# Patient Record
Sex: Male | Born: 2000 | Race: Asian | Hispanic: No | Marital: Single | Smoking: Never smoker
Health system: Southern US, Community
[De-identification: ages and names within clinical notes are randomized; demographics above are authoritative.]

## PROBLEM LIST (undated history)

## (undated) DIAGNOSIS — E079 Disorder of thyroid, unspecified: Secondary | ICD-10-CM

## (undated) DIAGNOSIS — J3081 Allergic rhinitis due to animal (cat) (dog) hair and dander: Secondary | ICD-10-CM

## (undated) DIAGNOSIS — J45909 Unspecified asthma, uncomplicated: Secondary | ICD-10-CM

## (undated) DIAGNOSIS — R000 Tachycardia, unspecified: Secondary | ICD-10-CM

## (undated) HISTORY — DX: Disorder of thyroid, unspecified: E07.9

---
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
## 2019-06-04 ENCOUNTER — Other Ambulatory Visit: Payer: Self-pay

## 2019-06-04 ENCOUNTER — Ambulatory Visit
Admission: EM | Admit: 2019-06-04 | Discharge: 2019-06-04 | Disposition: A | Discharge status: Home / Self Care | Payer: BC Managed Care – PPO

## 2019-06-04 DIAGNOSIS — R208 Other disturbances of skin sensation: Secondary | ICD-10-CM

## 2019-06-04 NOTE — ED Provider Notes (Signed)
[name]    CSN: [id number] Arrival date & time: 06/04/19  1324      History   Chief Complaint Chief Complaint  Patient [name] with  . burning sensation    HPI Rodney [name] is a 18 y.o. male.   Rodney [name] with a burning sensation of his skin. He reports this happened for the first time 1 week ago and then happened again today. He reports both episodes started with a "cold chill" that causes a whole body shiver, followed by a burning sensation that starts in the center of his face. The burning sensation intensifies after a few seconds, makes him grimace, then spread out to behind his ears, down his neck and back, and along his extremities. He reports the burning sensation is worse with movement. Both episodes lasted about 20 minutes and will improve without intervention. He has used hydrocortisone spray/cream that helps to improve the sensation faster. Reports both episode have occurred while driving. He reports feeling tired after the episode. He denies vision changes, headaches, anxiety, palpitations, dizziness, flushing of his skin, ringing in his ears, numbness, weakness, fever, or rashes. No history of diabetes.      Past Medical History:  Diagnosis Date  . Asthma     There are no active problems to display for this patient.   History reviewed. No pertinent surgical history.     Home Medications    Prior to Admission medications   Medication Sig Start Date End Date Taking? Authorizing Provider  albuterol (PROVENTIL HFA;VENTOLIN HFA) 108 (90 BASE) MCG/ACT inhaler Inhale into the lungs every 6 (six) hours as needed for wheezing or shortness of breath.    [provider]    Family History Family History  Problem Relation Age of Onset  . Healthy Mother   . Healthy Father     Social History Social History   Tobacco Use  . Smoking status: Never Smoker  . Smokeless tobacco: Never Used  Substance Use Topics  .  Alcohol use: No  . Drug use: No     Allergies   Patient has no known allergies.   Review of Systems Review of Systems  See HPI.    Physical Exam Triage Vital Signs ED Triage Vitals  Enc Vitals Group     BP 06/04/19 1346 115/72     Pulse Rate 06/04/19 1346 (!) 109     Resp 06/04/19 1346 16     Temp 06/04/19 1346 98.5 F (36.9 C)     Temp Source 06/04/19 1346 Oral     SpO2 06/04/19 1355 98 %     Weight --      Height --      Head Circumference --      Peak Flow --      Pain Score 06/04/19 1347 8     Pain Loc --      Pain Edu? --      Excl. in [place]? --    No data found.  Updated Vital Signs BP 115/72 (BP Location: Left Arm)   Pulse (!) 109   Temp 98.5 F (36.9 C) (Oral)   Resp 16   SpO2 98%   Physical Exam Constitutional:      General: He is not in acute distress.    Appearance: Normal appearance. He is not ill-appearing or toxic-appearing.  HENT:     Head: Normocephalic and atraumatic.     Nose: No congestion.  Eyes:     Extraocular Movements:  Extraocular movements intact.     Conjunctiva/sclera: Conjunctivae normal.     Pupils: Pupils are equal, round, and reactive to light.  Neck:     Musculoskeletal: Normal range of motion and neck supple.  Cardiovascular:     Rate and Rhythm: Normal rate and regular rhythm.  Pulmonary:     Effort: Pulmonary effort is normal.     Breath sounds: Normal breath sounds.  Musculoskeletal: Normal range of motion.  Skin:    General: Skin is warm and dry.     Findings: No erythema or rash.  Neurological:     General: No focal deficit present.     Mental Status: He is alert and oriented to person, place, and time.     GCS: GCS eye subscore is 4. GCS verbal subscore is 5. GCS motor subscore is 6.     Cranial Nerves: Cranial nerves are intact. No cranial nerve deficit.     Sensory: Sensation is intact. No sensory deficit.     Motor: Motor function is intact. No weakness.     Coordination: Coordination is intact.  Coordination normal.     Gait: Gait normal.     Comments: Patient able to ambulate on own without difficulty.   Psychiatric:        Mood and Affect: Mood normal.        Behavior: Behavior normal.    UC Treatments / Results  Labs (all labs ordered are listed, but only abnormal results are displayed) Labs Reviewed - No data to display  EKG   Radiology No results found.  Procedures Procedures (including critical care time)  Medications Ordered in UC Medications - No data to display  Initial Impression / Assessment and Plan / UC Course  I have reviewed the triage vital signs and the nursing notes.  Pertinent labs & imaging results that were available during my care of the patient were reviewed by me and considered in my medical decision making (see chart for details).   No focal deficits on exam today. No rashes or other concerning findings. Reassurance provided. Discussed following up with PCP if symptoms continue for further evaluation. Instructed not to use steroid cream on the face, but ok to use elsewhere. Return precautions were given. Patient verbalizes understanding.   Final Clinical Impressions(s) / UC Diagnoses   Final diagnoses:  Burning sensation of skin   ED Prescriptions    None        Ok Edwards, PA-C 06/04/19 1518

## 2019-06-04 NOTE — ED Triage Notes (Signed)
Pt presents to UC w/ c/o burning sensation that starts in center of face and radiates to other parts of entire body. Pt reports this happens at any time and happens when anything touches skin. Pt reports this started a week ago. Pt reports using hydrocortisone spray last week with little relief.

## 2019-06-04 NOTE — Discharge Instructions (Signed)
No alarming signs on exam. Follow up with PCP for further evaluation if symptoms not improving.

## 2019-06-27 ENCOUNTER — Other Ambulatory Visit: Payer: Self-pay

## 2019-06-27 DIAGNOSIS — Z20822 Contact with and (suspected) exposure to covid-19: Secondary | ICD-10-CM

## 2019-06-29 LAB — NOVEL CORONAVIRUS, NAA: SARS-CoV-2, NAA: NOT DETECTED

## 2019-09-03 ENCOUNTER — Other Ambulatory Visit: Payer: Self-pay

## 2019-09-03 DIAGNOSIS — Z20822 Contact with and (suspected) exposure to covid-19: Secondary | ICD-10-CM

## 2019-09-04 LAB — NOVEL CORONAVIRUS, NAA: SARS-CoV-2, NAA: NOT DETECTED

## 2019-09-05 ENCOUNTER — Ambulatory Visit
Admission: EM | Admit: 2019-09-05 | Discharge: 2019-09-05 | Disposition: A | Discharge status: Home / Self Care | Payer: BC Managed Care – PPO | Attending: Physician Assistant | Admitting: Physician Assistant

## 2019-09-05 DIAGNOSIS — J029 Acute pharyngitis, unspecified: Secondary | ICD-10-CM

## 2019-09-05 MED ORDER — LIDOCAINE VISCOUS HCL 2 % MT SOLN: OROMUCOSAL | 0 refills | Status: DC

## 2019-09-05 NOTE — ED Provider Notes (Signed)
[name]    CSN: [id number] Arrival date & time: 09/05/19  1006      History   Chief Complaint Chief Complaint  Patient presents with  . Sore Throat    HPI Rodney [name] is a 18 y.o. male.   18 year old male comes in for continued sore throat. States started having diarrhea and sore throat 5 days ago. Had some nausea without vomiting. Denies rhinorrhea, nasal congestion, cough. Denies fever, chills, body aches. Denies abdominal pain. Denies shortness of breath, loss of taste/smell. Went for COVID testing 2 days ago with negative testing. Now only with sore throat. No sick/COVID contact. Never smoker. No intervention tried.      Past Medical History:  Diagnosis Date  . Asthma     There are no active problems to display for this patient.   History reviewed. No pertinent surgical history.     Home Medications    Prior to Admission medications   Medication Sig Start Date End Date Taking? Authorizing Provider  albuterol (PROVENTIL HFA;VENTOLIN HFA) 108 (90 BASE) MCG/ACT inhaler Inhale into the lungs every 6 (six) hours as needed for wheezing or shortness of breath.    [provider]  lidocaine (XYLOCAINE) 2 % solution 5-15 mL gurgle as needed 09/05/19   Ok Edwards, PA-C    Family History Family History  Problem Relation Age of Onset  . Healthy Mother   . Healthy Father     Social History Social History   Tobacco Use  . Smoking status: Never Smoker  . Smokeless tobacco: Never Used  Substance Use Topics  . Alcohol use: No  . Drug use: No     Allergies   Patient has no known allergies.   Review of Systems Review of Systems  Reason unable to perform ROS: See HPI as above.     Physical Exam Triage Vital Signs ED Triage Vitals [09/05/19 1031]  Enc Vitals Group     BP 114/78     Pulse Rate (!) 109     Resp 18     Temp 98.4 F (36.9 C)     Temp Source Oral     SpO2 98 %     Weight      Height      Head  Circumference      Peak Flow      Pain Score 3     Pain Loc      Pain Edu?      Excl. in [place]?    No data found.  Updated Vital Signs BP 114/78 (BP Location: Left Arm)   Pulse (!) 109   Temp 98.4 F (36.9 C) (Oral)   Resp 18   SpO2 98%   Physical Exam Constitutional:      General: He is not in acute distress.    Appearance: Normal appearance. He is not ill-appearing, toxic-appearing or diaphoretic.  HENT:     Head: Normocephalic and atraumatic.     Right Ear: Tympanic membrane and ear canal normal. Tympanic membrane is not erythematous.     Left Ear: Tympanic membrane and ear canal normal. Tympanic membrane is not erythematous.     Mouth/Throat:     Mouth: Mucous membranes are moist.     Pharynx: Oropharynx is clear. Uvula midline.     Tonsils: No tonsillar exudate. 1+ on the right. 1+ on the left.  Neck:     Musculoskeletal: Normal range of motion and neck supple.  Cardiovascular:  Rate and Rhythm: Normal rate and regular rhythm.     Heart sounds: Normal heart sounds. No murmur. No friction rub. No gallop.      Comments: HR 98 Pulmonary:     Effort: Pulmonary effort is normal. No accessory muscle usage, prolonged expiration, respiratory distress or retractions.     Comments: Lungs clear to auscultation without adventitious lung sounds. Neurological:     General: No focal deficit present.     Mental Status: He is alert and oriented to person, place, and time.      UC Treatments / Results  Labs (all labs ordered are listed, but only abnormal results are displayed) Labs Reviewed - No data to display  EKG   Radiology No results found.  Procedures Procedures (including critical care time)  Medications Ordered in UC Medications - No data to display  Initial Impression / Assessment and Plan / UC Course  I have reviewed the triage vital signs and the nursing notes.  Pertinent labs & imaging results that were available during my care of the patient were  reviewed by me and considered in my medical decision making (see chart for details).    COVID testing was negative 11/30. No alarming signs on exam.  Patient speaking in full sentences without respiratory distress. States symptoms are improving. Symptomatic treatment discussed.  Push fluids.  Return precautions given.  Patient expresses understanding and agrees to plan.  Final Clinical Impressions(s) / UC Diagnoses   Final diagnoses:  Sore throat   ED Prescriptions    Medication Sig Dispense Auth. Provider   lidocaine (XYLOCAINE) 2 % solution 5-15 mL gurgle as needed 150 mL Belinda Fisher, PA-C     PDMP not reviewed this encounter.   Belinda Fisher, PA-C 09/05/19 1058

## 2019-09-05 NOTE — Discharge Instructions (Addendum)
No alarming signs on exam. If needed, start lidocaine for sore throat, do not eat or drink for the next 40 mins after use as it can stunt your gag reflex. Keep hydrated, urine should be clear to pale yellow in color. Monitor for any worsening of symptoms, swelling of the throat, trouble breathing, trouble swallowing, leaning forward to breath, drooling, go to the emergency department for further evaluation needed.  For sore throat/cough try using a honey-based tea. Use 3 teaspoons of honey with juice squeezed from half lemon. Place shaved pieces of ginger into 1/2-1 cup of water and warm over stove top. Then mix the ingredients and repeat every 4 hours as needed.

## 2019-09-05 NOTE — ED Triage Notes (Signed)
Pt c/o diarrhea and sore throat since Saturday, had a neg. COVID test on Monday and still experiencing a sore throat

## 2019-12-19 ENCOUNTER — Ambulatory Visit: Payer: Self-pay

## 2019-12-19 NOTE — Telephone Encounter (Signed)
Pt. Called to report swelling in lower neck region, below the Adam's apple.  Stated he first became aware of it in November or December.  Described as "golf ball sized swelling".  Stated it does not feel tender.  Denied any difficulty with swallowing.  Denied any fever/ chills.  Stated he has a headache today and has a runny nose.  Denied any soreness/ tenderness or swelling in the underarms or groin regions.  Stated his mother advised him to call, due to hx of Thyroid problems in the family.  Pt. does not have a PCP.  Environmental consultant at [employer].  Transferred patient to office to get an appt. To establish care with a Zeke Aker.  Agreed with plan.   Reason for Disposition . [1] Large node AND [2] present > 2 weeks    Pt. Unsure if this is an enlarged lymph node; described as golf ball sized swelling at lower neck below adams apple; non-tender.  Has been present since November or December.  Answer Assessment - Initial Assessment Questions 1. LOCATION: "Where is the swollen node located?" "Is the matching node on the other side of the body also swollen?"      Pt. Unsure of how to explain; feels like swollen gland at lower neck , just above the collar bone 2. SIZE: "How big is the node?" (Inches or centimeters) (or compare to common objects such as pea, bean, marble, golf ball)     Golf ball size 3. ONSET: "When did the swelling start?"     Thinks it was pointed out to him in Nov. Or Dec.; has not increased in size  4. NECK NODES: "Is there a sore throat, runny nose or other symptoms of a cold?"      No sore throat, or fever 5. GROIN OR ARMPIT NODES: "Is there a sore, scratch, cut or painful red area on that arm or leg?"      denied 6. FEVER: "Do you have a fever?" If so, ask: "What is it, how was it measured, and when did it start?"      denied 7. CAUSE: "What do you think is causing the swollen lymph nodes?"     unsure 8. OTHER SYMPTOMS: "Do you have any other symptoms?"     " Felt sick  today"; c/o headache and runny nose today  9. PREGNANCY: "Is there any chance you are pregnant?" "When was your last menstrual period?"     n/a  Protocols used: LYMPH NODES - [facility]

## 2019-12-21 ENCOUNTER — Other Ambulatory Visit: Payer: Self-pay

## 2019-12-21 ENCOUNTER — Ambulatory Visit (HOSPITAL_COMMUNITY)
Admission: EM | Admit: 2019-12-21 | Discharge: 2019-12-21 | Disposition: A | Discharge status: Home / Self Care | Payer: Managed Care, Other (non HMO) | Attending: Family Medicine | Admitting: Family Medicine

## 2019-12-21 ENCOUNTER — Encounter (HOSPITAL_COMMUNITY): Payer: Self-pay

## 2019-12-21 DIAGNOSIS — E049 Nontoxic goiter, unspecified: Secondary | ICD-10-CM | POA: Diagnosis present

## 2019-12-21 DIAGNOSIS — J4521 Mild intermittent asthma with (acute) exacerbation: Secondary | ICD-10-CM | POA: Diagnosis present

## 2019-12-21 HISTORY — DX: Allergic rhinitis due to animal (cat) (dog) hair and dander: J30.81

## 2019-12-21 LAB — T4, FREE: Free T4: >5.5 ng/dL — ABNORMAL HIGH (ref 0.61–1.12)

## 2019-12-21 LAB — TSH: TSH: <0.01 u[IU]/mL — ABNORMAL LOW (ref 0.350–4.500)

## 2019-12-21 MED ORDER — ALBUTEROL SULFATE HFA 108 (90 BASE) MCG/ACT IN AERS: 2.0000 | INHALATION_SPRAY | Freq: Four times a day (QID) | RESPIRATORY_TRACT | 11 refills | Status: DC | PRN

## 2019-12-21 MED ORDER — PREDNISONE 20 MG PO TABS: ORAL_TABLET | ORAL | 1 refills | Status: DC

## 2019-12-21 NOTE — ED Provider Notes (Signed)
[place]    CSN: [id number] Arrival date & time: 12/21/19  1605      History   Chief Complaint Chief Complaint  Patient presents with  . Asthma    HPI Rodney [name] is a 19 y.o. male.   Is a 19 year old male who presents for the first time in over 5 years to [facility] urgent care.  Pt presents with asthma flare (shortness of breath) he states was activated by his allergy to pet dander; pt states he was at his moms house and she has animals, he states he has been using his girlfriends inhaler because he doesn't have one.  Patient also knows that he has a markedly enlarged thyroid.  He has an appt on April 1st to have it evaluated.  No constipation, cold intolerance.  Positive F/H of hypothyroidism.     Past Medical History:  Diagnosis Date  . 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 Asthma     There are no problems to display for this patient.   History reviewed. No pertinent surgical history.     Home Medications    Prior to Admission medications   Medication Sig Start Date End Date Taking? Authorizing Provider  albuterol (VENTOLIN HFA) 108 (90 Base) MCG/ACT inhaler Inhale 2 puffs into the lungs every 6 (six) hours as needed for wheezing or shortness of breath. 12/21/19   Robyn Haber, MD  lidocaine (XYLOCAINE) 2 % solution 5-15 mL gurgle as needed 09/05/19   Tasia Catchings, Amy V, PA-C  predniSONE (DELTASONE) 20 MG tablet One daily with food 12/21/19   Robyn Haber, MD    Family History Family History  Problem Relation Age of Onset  . Healthy Mother   . Healthy Father     Social History Social History   Tobacco Use  . Smoking status: Never Smoker  . Smokeless tobacco: Never Used  Substance Use Topics  . Alcohol use: No  . Drug use: No     Allergies   Patient has no known allergies.   Review of Systems Review of Systems  Constitutional: Negative.   Respiratory: Positive for cough, chest tightness and wheezing.   All other systems  reviewed and are negative.    Physical Exam Triage Vital Signs ED Triage Vitals  Enc Vitals Group     BP 12/21/19 1650 134/70     Pulse Rate 12/21/19 1650 (!) 118     Resp 12/21/19 1650 20     Temp 12/21/19 1650 98.5 F (36.9 C)     Temp Source 12/21/19 1650 Oral     SpO2 12/21/19 1650 98 %     Weight --      Height --      Head Circumference --      Peak Flow --      Pain Score 12/21/19 1649 0     Pain Loc --      Pain Edu? --      Excl. in [place]? --    No data found.  Updated Vital Signs BP 134/70 (BP Location: Left Arm)   Pulse (!) 118   Temp 98.5 F (36.9 C) (Oral)   Resp 20   SpO2 98%    Physical Exam Vitals and nursing note reviewed.  Constitutional:      General: He is not in acute distress.    Appearance: Normal appearance. He is normal weight. He is not ill-appearing or toxic-appearing.  HENT:     Head: Normocephalic.  Nose: Nose normal.     Mouth/Throat:     Mouth: Mucous membranes are moist.  Eyes:     Conjunctiva/sclera: Conjunctivae normal.  Neck:     Comments: Diffuse and markedly enlarged thyroid Cardiovascular:     Rate and Rhythm: Normal rate and regular rhythm.     Pulses: Normal pulses.     Heart sounds: Normal heart sounds.  Pulmonary:     Effort: Pulmonary effort is normal.     Breath sounds: Normal breath sounds.  Musculoskeletal:        General: Normal range of motion.     Cervical back: Normal range of motion and neck supple.  Skin:    General: Skin is warm and dry.  Neurological:     General: No focal deficit present.     Mental Status: He is alert and oriented to person, place, and time.  Psychiatric:        Mood and Affect: Mood normal.      UC Treatments / Results  Labs (all labs ordered are listed, but only abnormal results are displayed) Labs Reviewed  TSH  T4, FREE    EKG   Radiology No results found.  Procedures Procedures (including critical care time)  Medications Ordered in UC Medications - No  data to display  Initial Impression / Assessment and Plan / UC Course  I have reviewed the triage vital signs and the nursing notes.  Pertinent labs & imaging results that were available during my care of the patient were reviewed by me and considered in my medical decision making (see chart for details).    Final Clinical Impressions(s) / UC Diagnoses   Final diagnoses:  Mild intermittent asthma with exacerbation  Thyroid goiter     Discharge Instructions     Keep your appointment to check the thyroid    ED Prescriptions    Medication Sig Dispense Auth. Provider   albuterol (VENTOLIN HFA) 108 (90 Base) MCG/ACT inhaler Inhale 2 puffs into the lungs every 6 (six) hours as needed for wheezing or shortness of breath. 18 g Elvina Sidle, MD   predniSONE (DELTASONE) 20 MG tablet One daily with food 5 tablet Elvina Sidle, MD     I have reviewed the PDMP during this encounter.   Elvina Sidle, MD 12/21/19 1723

## 2019-12-21 NOTE — ED Triage Notes (Signed)
Pt presents with asthma flare (shortness of breath) he states was activated by his allergy to pet dander; pt states he was at his moms house and she has animals, he states he has been using his girlfriends inhaler because he doesn't have one.

## 2019-12-21 NOTE — Discharge Instructions (Addendum)
Keep your appointment to check the thyroid

## 2019-12-22 ENCOUNTER — Telehealth (HOSPITAL_COMMUNITY): Payer: Self-pay | Admitting: Emergency Medicine

## 2019-12-22 NOTE — Telephone Encounter (Signed)
Spoke with KL; pt to move his appointment to next week and be seen for abnormal TSH per KL; pt verbalized understanding

## 2020-01-03 ENCOUNTER — Other Ambulatory Visit: Payer: Self-pay

## 2020-01-03 ENCOUNTER — Encounter: Payer: Self-pay | Admitting: Family Medicine

## 2020-01-03 ENCOUNTER — Ambulatory Visit (INDEPENDENT_AMBULATORY_CARE_PROVIDER_SITE_OTHER): Payer: Managed Care, Other (non HMO) | Admitting: Family Medicine

## 2020-01-03 VITALS — BP 134/68 | Temp 98.7°F | Ht 69.0 in | Wt 146.6 lb

## 2020-01-03 DIAGNOSIS — E0590 Thyrotoxicosis, unspecified without thyrotoxic crisis or storm: Secondary | ICD-10-CM

## 2020-01-03 DIAGNOSIS — R000 Tachycardia, unspecified: Secondary | ICD-10-CM | POA: Diagnosis not present

## 2020-01-03 DIAGNOSIS — Z23 Encounter for immunization: Secondary | ICD-10-CM

## 2020-01-03 DIAGNOSIS — E010 Iodine-deficiency related diffuse (endemic) goiter: Secondary | ICD-10-CM | POA: Diagnosis not present

## 2020-01-03 LAB — T3, FREE: T3, Free: 28.1 pg/mL — ABNORMAL HIGH (ref 2.3–4.2)

## 2020-01-03 MED ORDER — METOPROLOL SUCCINATE ER 25 MG PO TB24: 25.0000 mg | ORAL_TABLET | Freq: Every day | ORAL | 2 refills | Status: DC

## 2020-01-03 NOTE — Patient Instructions (Addendum)

## 2020-01-03 NOTE — Progress Notes (Signed)
Established Patient Office Visit  Subjective:  Patient ID: Rodney [name], male    DOB: [birth year]  Age: 19 y.o. MRN: [id number]  CC:  Chief Complaint  Patient presents with  . Establish Care    New patient no concerns.     HPI Rodney [name] presents for establishment of care and hospital follow-up after he was told that his thyroid gland is overactive.  Patient does admit to heat intolerance, difficulty gaining weight and nervousness.  He has not noticed a rapid heart rate.  He has a history of asthma.  Well-controlled by rescue inhaler alone.  The only time he currently seems to bother him is when he visits his mother's house.  She has pets.  He tries to stay no longer than an hour and he sees her about once a week.  Past Medical History:  Diagnosis Date  . 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 Asthma   . Thyroid disease     History reviewed. No pertinent surgical history.  Family History  Problem Relation Age of Onset  . Healthy Mother   . Healthy Father   . Healthy Maternal Grandmother   . Thyroid disease Maternal Grandmother   . Healthy Maternal Grandfather   . Healthy Paternal Grandmother   . Healthy Paternal Grandfather     Social History   Socioeconomic History  . Marital status: Single    Spouse name: Not on file  . Number of children: Not on file  . Years of education: Not on file  . Highest education level: Not on file  Occupational History  . Not on file  Tobacco Use  . Smoking status: Never Smoker  . Smokeless tobacco: Never Used  Substance and Sexual Activity  . Alcohol use: No  . Drug use: No  . Sexual activity: Yes  Other Topics Concern  . Not on file  Social History Narrative  . Not on file   Social Determinants of Health   Financial Resource Strain:   . Difficulty of Paying Living Expenses:   Food Insecurity:   . Worried About Charity fundraiser in the Last Year:   . Arboriculturist in the Last Year:   Transportation  Needs:   . Film/video editor (Medical):   Marland Kitchen Lack of Transportation (Non-Medical):   Physical Activity:   . Days of Exercise per Week:   . Minutes of Exercise per Session:   Stress:   . Feeling of Stress :   Social Connections:   . Frequency of Communication with Friends and Family:   . Frequency of Social Gatherings with Friends and Family:   . Attends Religious Services:   . Active Member of Clubs or Organizations:   . Attends Archivist Meetings:   Marland Kitchen Marital Status:   Intimate Partner Violence:   . Fear of Current or Ex-Partner:   . Emotionally Abused:   Marland Kitchen Physically Abused:   . Sexually Abused:     Outpatient Medications Prior to Visit  Medication Sig Dispense Refill  . albuterol (VENTOLIN HFA) 108 (90 Base) MCG/ACT inhaler Inhale 2 puffs into the lungs every 6 (six) hours as needed for wheezing or shortness of breath. 18 g 11  . lidocaine (XYLOCAINE) 2 % solution 5-15 mL gurgle as needed (Patient not taking: Reported on 01/03/2020) 150 mL 0  . predniSONE (DELTASONE) 20 MG tablet One daily with food (Patient not taking: Reported on 01/03/2020) 5 tablet 1   No facility-administered  medications prior to visit.    No Known Allergies  ROS Review of Systems  Constitutional: Positive for unexpected weight change. Negative for diaphoresis, fatigue and fever.  HENT: Negative.   Eyes: Negative for photophobia and visual disturbance.  Respiratory: Negative.   Cardiovascular: Negative for palpitations.  Gastrointestinal: Negative.   Endocrine: Positive for heat intolerance. Negative for cold intolerance.  Skin: Negative for pallor and rash.  Neurological: Negative for tremors and speech difficulty.  Hematological: Does not bruise/bleed easily.  Psychiatric/Behavioral: The patient is nervous/anxious.    Depression screen [facility] 2/9 01/03/2020 01/03/2020  Decreased Interest 0 0  Down, Depressed, Hopeless 0 0  PHQ - 2 Score 0 0  Altered sleeping 0 -  Tired, decreased  energy 0 -  Change in appetite 1 -  Feeling bad or failure about yourself  0 -  Trouble concentrating 0 -  Moving slowly or fidgety/restless 0 -  Suicidal thoughts 0 -  PHQ-9 Score 1 -  Difficult doing work/chores Not difficult at all -      Objective:    Physical Exam  Constitutional: He is oriented to person, place, and time. He appears well-developed and well-nourished. No distress.  HENT:  Head: Normocephalic and atraumatic.  Right Ear: External ear normal.  Left Ear: External ear normal.  Mouth/Throat: Oropharynx is clear and moist. No oropharyngeal exudate.  Eyes: Pupils are equal, round, and reactive to light. Conjunctivae are normal. Right eye exhibits no discharge. Left eye exhibits no discharge.    Neck: No JVD present. No tracheal deviation present. Thyromegaly (grossly enlarged.  mild ttp. ) present.  Cardiovascular: Regular rhythm. Tachycardia present.  Pulmonary/Chest: Effort normal and breath sounds normal. No stridor.  Lymphadenopathy:    He has no cervical adenopathy.  Neurological: He is alert and oriented to person, place, and time.  Skin: Skin is warm and dry. He is not diaphoretic.  Psychiatric: He has a normal mood and affect. His behavior is normal.    BP 134/68   Temp 98.7 F (37.1 C) (Tympanic)   Ht '5\' 9"'$  (1.753 m)   Wt 146 lb 9.6 oz (66.5 kg)   SpO2 98%   BMI 21.65 kg/m  Wt Readings from Last 3 Encounters:  01/03/20 146 lb 9.6 oz (66.5 kg) (37 %, Z= -0.33)*  07/24/14 126 lb (57.2 kg) (70 %, Z= 0.51)*  12/30/13 117 lb (53.1 kg) (67 %, Z= 0.44)*   * Growth percentiles are based on CDC (Boys, 2-20 Years) data.     Health Maintenance Due  Topic Date Due  . HIV Screening  Never done    There are no preventive care reminders to display for this patient.  Lab Results  Component Value Date   TSH <0.010 (L) 12/21/2019   No results found for: WBC, HGB, HCT, MCV, PLT No results found for: NA, K, CHLORIDE, CO2, GLUCOSE, BUN, CREATININE,  BILITOT, ALKPHOS, AST, ALT, PROT, ALBUMIN, CALCIUM, ANIONGAP, EGFR, GFR No results found for: CHOL No results found for: HDL No results found for: LDLCALC No results found for: TRIG No results found for: CHOLHDL No results found for: HGBA1C    Assessment & Plan:   Problem List Items Addressed This Visit      Endocrine   Hyperthyroidism   Relevant Medications   metoprolol succinate (TOPROL-XL) 25 MG 24 hr tablet   Other Relevant Orders   T3, free   Thyroid stimulating immunoglobulin   US THYROID   Ambulatory referral to Endocrinology   Thyroid peroxidase  antibody   Thyromegaly   Relevant Medications   metoprolol succinate (TOPROL-XL) 25 MG 24 hr tablet   Other Relevant Orders   US THYROID     Other   Tachycardia   Relevant Medications   metoprolol succinate (TOPROL-XL) 25 MG 24 hr tablet   Need for Tdap vaccination - Primary   Relevant Orders   Tdap vaccine greater than or equal to 7yo IM (Completed)      Meds ordered this encounter  Medications  . metoprolol succinate (TOPROL-XL) 25 MG 24 hr tablet    Sig: Take 1 tablet (25 mg total) by mouth daily.    Dispense:  30 tablet    Refill:  2    Follow-up: Return in about 1 month (around 02/02/2020).   Patient was given information on hyperthyroidism.  We discussed the thyroid gland and its function for the body. Libby Maw, MD

## 2020-01-04 LAB — THYROID PEROXIDASE ANTIBODY: Thyroperoxidase Ab SerPl-aCnc: >900 IU/mL — ABNORMAL HIGH (ref <9)

## 2020-01-09 LAB — THYROID STIMULATING IMMUNOGLOBULIN: TSI: 314 % baseline — ABNORMAL HIGH (ref <140)

## 2020-01-21 ENCOUNTER — Emergency Department (HOSPITAL_COMMUNITY)
Admission: EM | Admit: 2020-01-21 | Discharge: 2020-01-21 | Disposition: A | Discharge status: Home / Self Care | Payer: Managed Care, Other (non HMO) | Attending: Emergency Medicine | Admitting: Emergency Medicine

## 2020-01-21 ENCOUNTER — Encounter (HOSPITAL_COMMUNITY): Payer: Self-pay | Admitting: Emergency Medicine

## 2020-01-21 ENCOUNTER — Emergency Department (HOSPITAL_COMMUNITY): Payer: Managed Care, Other (non HMO)

## 2020-01-21 ENCOUNTER — Encounter: Payer: Self-pay | Admitting: Family Medicine

## 2020-01-21 DIAGNOSIS — R0602 Shortness of breath: Secondary | ICD-10-CM | POA: Diagnosis not present

## 2020-01-21 DIAGNOSIS — R0789 Other chest pain: Secondary | ICD-10-CM | POA: Diagnosis present

## 2020-01-21 DIAGNOSIS — R000 Tachycardia, unspecified: Secondary | ICD-10-CM | POA: Insufficient documentation

## 2020-01-21 DIAGNOSIS — Z5321 Procedure and treatment not carried out due to patient leaving prior to being seen by health care provider: Secondary | ICD-10-CM | POA: Insufficient documentation

## 2020-01-21 LAB — BASIC METABOLIC PANEL
Anion gap: 9 (ref 5–15)
BUN: 20 mg/dL (ref 6–20)
CO2: 25 mmol/L (ref 22–32)
Calcium: 9.7 mg/dL (ref 8.9–10.3)
Chloride: 103 mmol/L (ref 98–111)
Creatinine, Ser: 0.61 mg/dL (ref 0.61–1.24)
GFR calc Af Amer: >60 mL/min (ref >60)
GFR calc non Af Amer: >60 mL/min (ref >60)
Glucose, Bld: 116 mg/dL — ABNORMAL HIGH (ref 70–99)
Potassium: 4.2 mmol/L (ref 3.5–5.1)
Sodium: 137 mmol/L (ref 135–145)

## 2020-01-21 LAB — TROPONIN I (HIGH SENSITIVITY)
Troponin I (High Sensitivity): <2 ng/L (ref <18)
Troponin I (High Sensitivity): <2 ng/L (ref <18)

## 2020-01-21 LAB — CBC
HCT: 40.7 % (ref 39.0–52.0)
Hemoglobin: 13.2 g/dL (ref 13.0–17.0)
MCH: 27.5 pg (ref 26.0–34.0)
MCHC: 32.4 g/dL (ref 30.0–36.0)
MCV: 84.8 fL (ref 80.0–100.0)
Platelets: 242 10*3/uL (ref 150–400)
RBC: 4.8 MIL/uL (ref 4.22–5.81)
RDW: 12.6 % (ref 11.5–15.5)
WBC: 5.4 10*3/uL (ref 4.0–10.5)
nRBC: 0 % (ref 0.0–0.2)

## 2020-01-21 MED ORDER — SODIUM CHLORIDE 0.9% FLUSH: 3.0000 mL | Freq: Once | INTRAVENOUS | Status: DC

## 2020-01-21 NOTE — ED Notes (Signed)
Pt name called to be triage, no response 

## 2020-01-21 NOTE — ED Notes (Addendum)
Pt advised against leaving. Pt left waiting room 

## 2020-01-21 NOTE — ED Triage Notes (Signed)
Pt arrives to ER with c/o of chest pain and sob that he states just started today, pt states his watch has been alerting him for over a few weeks that his heart rate will spike up in the 120's, but normally feels fine. Today it alerted him and he was having cp and mildly sob.

## 2020-02-04 ENCOUNTER — Ambulatory Visit (HOSPITAL_COMMUNITY)
Admission: EM | Admit: 2020-02-04 | Discharge: 2020-02-04 | Disposition: A | Discharge status: Home / Self Care | Payer: Managed Care, Other (non HMO) | Attending: Physician Assistant | Admitting: Physician Assistant

## 2020-02-04 ENCOUNTER — Encounter (HOSPITAL_COMMUNITY): Payer: Self-pay

## 2020-02-04 ENCOUNTER — Other Ambulatory Visit: Payer: Self-pay

## 2020-02-04 DIAGNOSIS — R079 Chest pain, unspecified: Secondary | ICD-10-CM

## 2020-02-04 DIAGNOSIS — E0590 Thyrotoxicosis, unspecified without thyrotoxic crisis or storm: Secondary | ICD-10-CM

## 2020-02-04 HISTORY — DX: Tachycardia, unspecified: R00.0

## 2020-02-04 NOTE — Discharge Instructions (Signed)
Refill and restart your metoprolol today  If continued symptoms despite restarting this, please report to the emergency department for further evaluation  Follow up with all of your scheduled appointments

## 2020-02-04 NOTE — ED Provider Notes (Signed)
[facility]    CSN: [id number] Arrival date & time: 02/04/20  1149      History   Chief Complaint Chief Complaint  Patient presents with  . Chest Pain    HPI Rodney [name] is a 19 y.o. male.   Patient with recent diagnosed hyperthyroidism and goiter reports for evaluation of chest discomfort and fast heart rate.  He reports this started earlier today he felt his heart racing and had some chest pressure under his sternum.  He reports the chest discomfort/pressure has mostly resided by the time he reached the urgent care clinic.  Denies shortness of breath with the symptoms.  He still endorses feeling fast heart rate.  Endorses having some heat intolerance.  He reports he ran of his metoprolol over the last couple days.  He reports he was doing very well with use of metoprolol prior to this.  He reports he was diagnosed with the hyperthyroidism earlier in April and has scheduled follow-up with his primary care on 02/06/2020 and endocrinologist on 02/15/2020.  He has been on metoprolol 25 mg extended release.     Past Medical History:  Diagnosis Date  . 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 Asthma   . Tachycardia   . Thyroid disease     Patient Active Problem List   Diagnosis Date Noted  . Hyperthyroidism 01/03/2020  . 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 Tachycardia 01/03/2020  . Need for Tdap vaccination 01/03/2020    History reviewed. No pertinent surgical history.     Home Medications    Prior to Admission medications   Medication Sig Start Date End Date Taking? Authorizing Provider  albuterol (VENTOLIN HFA) 108 (90 Base) MCG/ACT inhaler Inhale 2 puffs into the lungs every 6 (six) hours as needed for wheezing or shortness of breath. 12/21/19   Elvina Sidle, MD  lidocaine (XYLOCAINE) 2 % solution 5-15 mL gurgle as needed Patient not taking: Reported on 01/03/2020 09/05/19   Belinda Fisher, PA-C  metoprolol succinate (TOPROL-XL) 25 MG 24 hr tablet Take 1 tablet (25 mg  total) by mouth daily. 01/03/20   Mliss Sax, MD  predniSONE (DELTASONE) 20 MG tablet One daily with food Patient not taking: Reported on 01/03/2020 12/21/19   Elvina Sidle, MD    Family History Family History  Problem Relation Age of Onset  . Healthy Mother   . Healthy Father   . Healthy Maternal Grandmother   . Thyroid disease Maternal Grandmother   . Healthy Maternal Grandfather   . Healthy Paternal Grandmother   . Healthy Paternal Grandfather     Social History Social History   Tobacco Use  . Smoking status: Never Smoker  . Smokeless tobacco: Never Used  Substance Use Topics  . Alcohol use: No  . Drug use: No     Allergies   Patient has no known allergies.   Review of Systems Review of Systems  Per HPI Physical Exam Triage Vital Signs ED Triage Vitals  Enc Vitals Group     BP 02/04/20 1157 (!) 146/65     Pulse Rate 02/04/20 1157 (!) 125     Resp 02/04/20 1157 19     Temp --      Temp Source 02/04/20 1157 Oral     SpO2 02/04/20 1157 100 %     Weight 02/04/20 1206 140 lb (63.5 kg)     Height 02/04/20 1206 5\' 9"  (1.753 m)     Head Circumference --  Peak Flow --      Pain Score 02/04/20 1205 8     Pain Loc --      Pain Edu? --      Excl. in [place]? --    No data found.  Updated Vital Signs BP (!) 146/65 (BP Location: Right Arm)   Pulse (!) 125   Resp 19   Ht 5\' 9"  (1.753 m)   Wt 140 lb (63.5 kg)   SpO2 100%   BMI 20.67 kg/m   Visual Acuity Right Eye Distance:   Left Eye Distance:   Bilateral Distance:    Right Eye Near:   Left Eye Near:    Bilateral Near:     Physical Exam Vitals and nursing note reviewed.  Constitutional:      General: He is not in acute distress.    Appearance: He is well-developed. He is not ill-appearing or diaphoretic.  HENT:     Head: Normocephalic and atraumatic.  Eyes:     Conjunctiva/sclera: Conjunctivae normal.  Neck:     Thyroid: Thyromegaly present.  Cardiovascular:     Rate and Rhythm:  Regular rhythm. Tachycardia present.     Heart sounds: No murmur.  Pulmonary:     Effort: Pulmonary effort is normal. No respiratory distress.     Breath sounds: Normal breath sounds. No decreased breath sounds, wheezing, rhonchi or rales.  Abdominal:     Palpations: Abdomen is soft.  Musculoskeletal:     Cervical back: Neck supple.  Skin:    General: Skin is warm and dry.  Neurological:     General: No focal deficit present.     Mental Status: He is alert and oriented to person, place, and time.  Psychiatric:        Mood and Affect: Mood normal.        Behavior: Behavior normal.      UC Treatments / Results  Labs (all labs ordered are listed, but only abnormal results are displayed) Labs Reviewed - No data to display  EKG Sinus tachycardia with RR prime in V1.  No contiguous ST elevation or T wave abnormality.  EKG is unchanged from 01/22/2020.  Radiology No results found.  Procedures Procedures (including critical care time)  Medications Ordered in UC Medications - No data to display  Initial Impression / Assessment and Plan / UC Course  I have reviewed the triage vital signs and the nursing notes.  Pertinent labs & imaging results that were available during my care of the patient were reviewed by me and considered in my medical decision making (see chart for details).  Labs, EKG and notes from 4/19-20/2021 emergency department visit were reviewed.    #Hypothyroidism #Chest pain Patient is a 19 year old with recent diagnosis of hyperthyroidism previously controlled on metoprolol for symptomatic control.  Given his benign metoprolol is likely explaining his current symptoms we will restart him on this.  Appears he did have a refill at the pharmacy and instructed him to refill accordingly.  EKG largely unchanged from previous.  Patient had negative troponins with similar presentation on 01/21/2020.  Patient has follow-up on 02/06/2020 with primary care.  Strict return  emergency department precautions were discussed with patient.  He verbalized understanding plan.  Case was discussed directly with supervising physician. Final Clinical Impressions(s) / UC Diagnoses   Final diagnoses:  Hyperthyroidism  Chest pain, unspecified type     Discharge Instructions     Refill and restart your metoprolol today  If continued symptoms  despite restarting this, please report to the emergency department for further evaluation  Follow up with all of your scheduled appointments      ED Prescriptions    None     PDMP not reviewed this encounter.   Hermelinda Medicus, PA-C 02/04/20 2304

## 2020-02-04 NOTE — ED Triage Notes (Signed)
Pt c/o 8/10 midsternal pressure that started today. Pt denies N/V, SOB. Pt has non labored breathing. Skin color is WNL.

## 2020-02-06 ENCOUNTER — Ambulatory Visit (INDEPENDENT_AMBULATORY_CARE_PROVIDER_SITE_OTHER): Payer: Managed Care, Other (non HMO) | Admitting: Family Medicine

## 2020-02-06 ENCOUNTER — Encounter: Payer: Self-pay | Admitting: Family Medicine

## 2020-02-06 ENCOUNTER — Other Ambulatory Visit: Payer: Self-pay

## 2020-02-06 VITALS — BP 118/68 | HR 110 | Temp 98.2°F | Ht 69.0 in | Wt 145.0 lb

## 2020-02-06 DIAGNOSIS — E0590 Thyrotoxicosis, unspecified without thyrotoxic crisis or storm: Secondary | ICD-10-CM | POA: Diagnosis not present

## 2020-02-06 DIAGNOSIS — R000 Tachycardia, unspecified: Secondary | ICD-10-CM

## 2020-02-06 NOTE — Patient Instructions (Signed)

## 2020-02-06 NOTE — Progress Notes (Addendum)
Established Patient Office Visit  Subjective:  Patient ID: Rodney [name], male    DOB: [birth year]  Age: 19 y.o. MRN: [id number]  CC:  Chief Complaint  Patient presents with  . Follow-up    1 month follow up on hyperthyroidism and tachycardia. Pt     HPI LEVELL TAVANO presents for follow-up of his hyper thyroidism.  He is doing okay. Denies loose stool or diarrhea.  He is experiencing some heat intolerance.  Denies diarrhea.  He does experience palpitations when he does not take his metoprolol.  Has an appointment with endocrinology on the 14th.   Past Medical History:  Diagnosis Date  . 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 Asthma   . Tachycardia   . Thyroid disease     History reviewed. No pertinent surgical history.  Family History  Problem Relation Age of Onset  . Healthy Mother   . Healthy Father   . Healthy Maternal Grandmother   . Thyroid disease Maternal Grandmother   . Healthy Maternal Grandfather   . Healthy Paternal Grandmother   . Healthy Paternal Grandfather     Social History   Socioeconomic History  . Marital status: Single    Spouse name: Not on file  . Number of children: Not on file  . Years of education: Not on file  . Highest education level: Not on file  Occupational History  . Not on file  Tobacco Use  . Smoking status: Never Smoker  . Smokeless tobacco: Never Used  Substance and Sexual Activity  . Alcohol use: No  . Drug use: No  . Sexual activity: Yes  Other Topics Concern  . Not on file  Social History Narrative  . Not on file   Social Determinants of Health   Financial Resource Strain:   . Difficulty of Paying Living Expenses:   Food Insecurity:   . Worried About Programme researcher, broadcasting/film/video in the Last Year:   . Barista in the Last Year:   Transportation Needs:   . Freight forwarder (Medical):   Marland Kitchen Lack of Transportation (Non-Medical):   Physical Activity:   . Days of Exercise per Week:   . Minutes of  Exercise per Session:   Stress:   . Feeling of Stress :   Social Connections:   . Frequency of Communication with Friends and Family:   . Frequency of Social Gatherings with Friends and Family:   . Attends Religious Services:   . Active Member of Clubs or Organizations:   . Attends Banker Meetings:   Marland Kitchen Marital Status:   Intimate Partner Violence:   . Fear of Current or Ex-Partner:   . Emotionally Abused:   Marland Kitchen Physically Abused:   . Sexually Abused:     Outpatient Medications Prior to Visit  Medication Sig Dispense Refill  . albuterol (VENTOLIN HFA) 108 (90 Base) MCG/ACT inhaler Inhale 2 puffs into the lungs every 6 (six) hours as needed for wheezing or shortness of breath. 18 g 11  . metoprolol succinate (TOPROL-XL) 25 MG 24 hr tablet Take 1 tablet (25 mg total) by mouth daily. 30 tablet 2  . lidocaine (XYLOCAINE) 2 % solution 5-15 mL gurgle as needed (Patient not taking: Reported on 01/03/2020) 150 mL 0  . predniSONE (DELTASONE) 20 MG tablet One daily with food (Patient not taking: Reported on 01/03/2020) 5 tablet 1   No facility-administered medications prior to visit.    No Known Allergies  ROS  Review of Systems  Constitutional: Negative.   HENT: Negative.   Eyes: Negative for photophobia and visual disturbance.  Respiratory: Negative.  Negative for chest tightness, shortness of breath and wheezing.   Cardiovascular: Positive for palpitations. Negative for chest pain and leg swelling.  Gastrointestinal: Negative for diarrhea.  Endocrine: Positive for heat intolerance.  Neurological: Negative.       Objective:    Physical Exam  Constitutional: He is oriented to person, place, and time. He appears well-developed and well-nourished. No distress.  Cardiovascular: Tachycardia present.  Pulmonary/Chest: Effort normal and breath sounds normal.  Neurological: He is alert and oriented to person, place, and time.  Skin: Skin is warm and dry. He is not diaphoretic.    Psychiatric: He has a normal mood and affect. His behavior is normal.    BP 118/68   Pulse (!) 110   Temp 98.2 F (36.8 C) (Tympanic)   Ht 5\' 9"  (1.753 m)   Wt 145 lb (65.8 kg)   SpO2 97%   BMI 21.41 kg/m  Wt Readings from Last 3 Encounters:  02/15/20 146 lb 3.2 oz (66.3 kg) (36 %, Z= -0.37)*  02/06/20 145 lb (65.8 kg) (34 %, Z= -0.42)*  02/04/20 140 lb (63.5 kg) (26 %, Z= -0.65)*   * Growth percentiles are based on CDC (Boys, 2-20 Years) data.     Health Maintenance Due  Topic Date Due  . HIV Screening  Never done    There are no preventive care reminders to display for this patient.  Lab Results  Component Value Date   TSH <0.01 (L) 02/15/2020   Lab Results  Component Value Date   WBC 5.4 01/21/2020   HGB 13.2 01/21/2020   HCT 40.7 01/21/2020   MCV 84.8 01/21/2020   PLT 242 01/21/2020   Lab Results  Component Value Date   NA 137 01/21/2020   K 4.2 01/21/2020   CO2 25 01/21/2020   GLUCOSE 116 (H) 01/21/2020   BUN 20 01/21/2020   CREATININE 0.61 01/21/2020   CALCIUM 9.7 01/21/2020   ANIONGAP 9 01/21/2020   No results found for: CHOL No results found for: HDL No results found for: LDLCALC No results found for: TRIG No results found for: CHOLHDL No results found for: HGBA1C    Assessment & Plan:   Problem List Items Addressed This Visit      Endocrine   Hyperthyroidism - Primary     Other   Tachycardia      No orders of the defined types were placed in this encounter.   Follow-up: Return in about 3 months (around 05/08/2020).  Continue toprol for control of pulse rate.   Spent 20 minutes with this patient. Libby Maw, MD

## 2020-02-13 ENCOUNTER — Other Ambulatory Visit: Payer: Self-pay

## 2020-02-15 ENCOUNTER — Other Ambulatory Visit: Payer: Self-pay

## 2020-02-15 ENCOUNTER — Encounter: Payer: Self-pay | Admitting: Internal Medicine

## 2020-02-15 ENCOUNTER — Ambulatory Visit (INDEPENDENT_AMBULATORY_CARE_PROVIDER_SITE_OTHER): Payer: Managed Care, Other (non HMO) | Admitting: Internal Medicine

## 2020-02-15 VITALS — BP 122/62 | HR 104 | Ht 69.0 in | Wt 146.2 lb

## 2020-02-15 DIAGNOSIS — E0590 Thyrotoxicosis, unspecified without thyrotoxic crisis or storm: Secondary | ICD-10-CM | POA: Diagnosis not present

## 2020-02-15 DIAGNOSIS — E0500 Thyrotoxicosis with diffuse goiter without thyrotoxic crisis or storm: Secondary | ICD-10-CM | POA: Diagnosis not present

## 2020-02-15 LAB — TSH: TSH: <0.01 u[IU]/mL — ABNORMAL LOW (ref 0.40–5.00)

## 2020-02-15 LAB — T4, FREE: Free T4: 5.5 ng/dL — ABNORMAL HIGH (ref 0.60–1.60)

## 2020-02-15 MED ORDER — METHIMAZOLE 5 MG PO TABS: 10.0000 mg | ORAL_TABLET | Freq: Every day | ORAL | 6 refills | Status: DC

## 2020-02-15 MED ORDER — DILTIAZEM HCL ER COATED BEADS 120 MG PO CP24: 120.0000 mg | ORAL_CAPSULE | Freq: Every day | ORAL | 6 refills | Status: DC

## 2020-02-15 NOTE — Patient Instructions (Signed)
We recommend that you follow these hyperthyroidism instructions at home:  1) Take Methimazole   If you develop severe sore throat with high fevers OR develop unexplained yellowing of your skin, eyes, under your tongue, severe abdominal pain with nausea or vomiting --> then please get evaluated immediately.  2) Cardizem  120 mg one time a day 3) STOP Metoprolol  4) Get repeat thyroid labs 6 weeks .   It is ESSENTIAL to get follow-up labs to help avoid over or undertreatment of your hyperthyroidism - both of which can be dangerous to your health.

## 2020-02-15 NOTE — Progress Notes (Signed)
Name: Rodney [name]  MRN/ DOB: [id number], June 06, 2001    Age/ Sex: 19 y.o., male    PCP: Mliss Sax, MD   Reason for Endocrinology Evaluation: Hyperthyroidism     Date of Initial Endocrinology Evaluation: 02/15/2020     HPI: Rodney [name] is a 19 y.o. male with a past medical history of Asthma. The patient presented for initial endocrinology clinic visit on 02/15/2020 for consultative assistance with his Hyperthyroidism.   Pt was diagnosed with hyperthyroidism in 12/2019 with a suppressed TSH of < 0.01 uIU/mL and elevated FT4 at > 5.50 ng/dL and elevated FT3 at 78.2 pg/mL during evaluation of asthma attack. He was noted to have thyromegaly at the time.   He lost 10 lbs from December to February but recently has gained weight  He has palpitation and tremors  He has noted loose stools  Has noted anxiety and jittery feeling Denies local neck symptoms    No biotin use No radiation exposure.   He has been on Metoprolol without worsening asthma   Maternal Grandmother and aunt with thyroid disease HISTORY:  Past Medical History:  Past Medical History:  Diagnosis Date  . 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 Asthma   . Tachycardia   . Thyroid disease    Past Surgical History: No past surgical history on file.  Social History:  reports that he has never smoked. He has never used smokeless tobacco. He reports that he does not drink alcohol or use drugs. Family History: family history includes Healthy in his father, maternal grandfather, maternal grandmother, mother, paternal grandfather, and paternal grandmother; Thyroid disease in his maternal grandmother.   HOME MEDICATIONS: Allergies as of 02/15/2020   No Known Allergies     Medication List       Accurate as of Feb 15, 2020  9:24 AM. If you have any questions, ask your nurse or doctor.        STOP taking these medications   lidocaine 2 % solution Commonly known as: XYLOCAINE Stopped  by: Scarlette Shorts, MD   metoprolol succinate 25 MG 24 hr tablet Commonly known as: TOPROL-XL Stopped by: Scarlette Shorts, MD   predniSONE 20 MG tablet Commonly known as: DELTASONE Stopped by: Scarlette Shorts, MD     TAKE these medications   albuterol 108 (90 Base) MCG/ACT inhaler Commonly known as: VENTOLIN HFA Inhale 2 puffs into the lungs every 6 (six) hours as needed for wheezing or shortness of breath.   diltiazem 120 MG 24 hr capsule Commonly known as: Cardizem CD Take 1 capsule (120 mg total) by mouth daily. Started by: Scarlette Shorts, MD         REVIEW OF SYSTEMS: A comprehensive ROS was conducted with the patient and is negative except as per HPI    OBJECTIVE:  VS: BP 122/62 (BP Location: Left Arm, Patient Position: Sitting, Cuff Size: Normal)   Pulse (!) 104   Ht 5\' 9"  (1.753 m)   Wt 146 lb 3.2 oz (66.3 kg)   SpO2 99%   BMI 21.59 kg/m    Wt Readings from Last 3 Encounters:  02/15/20 146 lb 3.2 oz (66.3 kg) (36 %, Z= -0.37)*  02/06/20 145 lb (65.8 kg) (34 %, Z= -0.42)*  02/04/20 140 lb (63.5 kg) (26 %, Z= -0.65)*   * Growth percentiles are based on CDC (Boys, 2-20 Years) data.     EXAM: General: Pt appears well and is in NAD  Eyes: External eye exam normal without stare, lid lag or exophthalmos.  EOM intact.    Neck: General: Supple without adenopathy. Thyroid: Thyroid size enlarged ~ 80 grams.  No nodules appreciated.+  thyroid bruit.  Lungs: Clear with good BS bilat with no rales, rhonchi, or wheezes  Heart: Auscultation: RRR.  Abdomen: Normoactive bowel sounds, soft, nontender, without masses or organomegaly palpable  Extremities:  BL LE: No pretibial edema normal ROM and strength.  Skin: Hair: Texture and amount normal with gender appropriate distribution Skin Inspection: No rashes Skin Palpation: Skin temperature, texture, and thickness normal to palpation  Neuro: Cranial nerves: II - XII grossly intact  Motor: Normal  strength throughout DTRs: 2+ and symmetric in UE without delay in relaxation phase  Mental Status: Judgment, insight: Intact Orientation: Oriented to time, place, and person Mood and affect: No depression, anxiety, or agitation     DATA REVIEWED: Results for ALLEY, GORE (MRN [id number]) as of 02/15/2020 12:44  Ref. Range 02/15/2020 08:56  TSH Latest Ref Range: 0.40 - 5.00 uIU/mL <0.01 (L)  T4,Free(Direct) Latest Ref Range: 0.60 - 1.60 ng/dL 4.09 (H)     TSI <811 % baseline 314High     Results for RADLEE, MANECKE (MRN [id number]) as of 02/15/2020 07:14  Ref. Range 01/03/2020 11:57  Thyroperoxidase Ab SerPl-aCnc Latest Ref Range: <9 IU/mL >900 (H)    ASSESSMENT/PLAN/RECOMMENDATIONS:   Hyperthyroidism Secondary to Graves' Disease:  - Pt is clinically hyperthyroid - No local neck symptoms  - We discussed that Graves' Disease is a result of an autoimmune condition involving the thyroid.    We discussed with pt the benefits of methimazole in the Tx of hyperthyroidism, as well as the possible side effects/complications of anti-thyroid drug Tx (specifically detailing the rare, but serious side effect of agranulocytosis). He was informed of need for regular thyroid function monitoring while on methimazole to ensure appropriate dosage without over-treatment. As well, we discussed the possible side effects of methimazole including the chance of rash, the small chance of liver irritation/juandice and the <=1 in 300-400 chance of sudden onset agranulocytosis.  We discussed importance of going to ED promptly (and stopping methimazole) if hewere to develop significant fever with severe sore throat of other evidence of acute infection.     We extensively discussed the various treatment options for hyperthyroidism and Graves disease including ablation therapy with radioactive iodine versus antithyroid drug treatment versus surgical therapy.  We recommended to the patient that we  felt, at this time, that Thionamide therapy would be most optimal.  We discussed the various possible benefits versus side effects of the various therapies.   I carefully explained to the patient that one of the consequences of I-131 ablation treatment would likely be permanent hypothyroidism which would require long-term replacement therapy with LT4.   Medications : Stop Metoprolol  Start Cardizem 120 mg daily  Methimazole 5 mg, TWO tablets daily  2 Graves' Disease:   - No extra- thyroidal manifestations of Graves' disease. Pt advised to have an updated eye exam and to notify provider of graves' disease diagnosis.       F/U in 3 months   Addendum: Attempted to call the pt on 02/15/2020 @ 1245 but no answer. Left as message to log in to the portal.      Signed electronically by: Lyndle Herrlich, MD  [facility]  [facility] [address] [place] [place], [place] [address] Phone: [phone] FAX: [phone]   CC: Doreene Burke  Talmadge Coventry, MD [address] [place] [address] Phone: [phone] Fax: [phone]   Return to Endocrinology clinic as below: Future Appointments  Date Time Provider Department Center  03/28/2020  3:00 PM [facility] None  05/08/2020  2:00 PM Mliss Sax, MD [facility]  05/30/2020  3:00 PM Vaughn Frieze, Konrad Dolores, MD [facility] None

## 2020-02-16 LAB — T3: T3, Total: 751 ng/dL — ABNORMAL HIGH (ref 86–192)

## 2020-03-28 ENCOUNTER — Other Ambulatory Visit: Payer: Self-pay

## 2020-03-28 ENCOUNTER — Other Ambulatory Visit (INDEPENDENT_AMBULATORY_CARE_PROVIDER_SITE_OTHER): Payer: Managed Care, Other (non HMO)

## 2020-03-28 ENCOUNTER — Encounter: Payer: Self-pay | Admitting: Internal Medicine

## 2020-03-28 DIAGNOSIS — E0590 Thyrotoxicosis, unspecified without thyrotoxic crisis or storm: Secondary | ICD-10-CM | POA: Diagnosis not present

## 2020-03-28 LAB — T4, FREE: Free T4: 0.26 ng/dL — ABNORMAL LOW (ref 0.60–1.60)

## 2020-03-28 LAB — TSH: TSH: 9.33 u[IU]/mL — ABNORMAL HIGH (ref 0.40–5.00)

## 2020-05-01 ENCOUNTER — Ambulatory Visit (HOSPITAL_COMMUNITY)
Admission: EM | Admit: 2020-05-01 | Discharge: 2020-05-01 | Disposition: A | Discharge status: Home / Self Care | Payer: Managed Care, Other (non HMO) | Attending: Emergency Medicine | Admitting: Emergency Medicine

## 2020-05-01 ENCOUNTER — Other Ambulatory Visit: Payer: Self-pay

## 2020-05-01 DIAGNOSIS — R000 Tachycardia, unspecified: Secondary | ICD-10-CM | POA: Insufficient documentation

## 2020-05-01 DIAGNOSIS — Z79899 Other long term (current) drug therapy: Secondary | ICD-10-CM | POA: Diagnosis not present

## 2020-05-01 DIAGNOSIS — E0500 Thyrotoxicosis with diffuse goiter without thyrotoxic crisis or storm: Secondary | ICD-10-CM | POA: Diagnosis not present

## 2020-05-01 DIAGNOSIS — U071 COVID-19: Secondary | ICD-10-CM | POA: Insufficient documentation

## 2020-05-01 DIAGNOSIS — J45909 Unspecified asthma, uncomplicated: Secondary | ICD-10-CM | POA: Insufficient documentation

## 2020-05-01 DIAGNOSIS — B349 Viral infection, unspecified: Secondary | ICD-10-CM | POA: Diagnosis present

## 2020-05-01 DIAGNOSIS — R509 Fever, unspecified: Secondary | ICD-10-CM | POA: Insufficient documentation

## 2020-05-01 DIAGNOSIS — R519 Headache, unspecified: Secondary | ICD-10-CM | POA: Diagnosis not present

## 2020-05-01 NOTE — ED Provider Notes (Signed)
[facility]    CSN: [id number] Arrival date & time: 05/01/20  1401      History   Chief Complaint Chief Complaint  Patient presents with  . Fever  . Headache    HPI Rodney [name] is a 19 y.o. male.   19 yr old male pt, unknown illness exposure presents with headache,congestion,cough, fever, un vaccinated.   The history is provided by the patient. No language interpreter was used.    Past Medical History:  Diagnosis Date  . 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 Asthma   . Tachycardia   . Thyroid disease     Patient Active Problem List   Diagnosis Date Noted  . Nonspecific syndrome suggestive of viral illness 05/01/2020  . Graves disease 02/15/2020  . Hyperthyroidism 01/03/2020  . 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 Tachycardia 01/03/2020  . Need for Tdap vaccination 01/03/2020    No past surgical history on file.     Home Medications    Prior to Admission medications   Medication Sig Start Date End Date Taking? Authorizing Provider  albuterol (VENTOLIN HFA) 108 (90 Base) MCG/ACT inhaler Inhale 2 puffs into the lungs every 6 (six) hours as needed for wheezing or shortness of breath. 12/21/19   Elvina Sidle, MD  diltiazem (CARDIZEM CD) 120 MG 24 hr capsule Take 1 capsule (120 mg total) by mouth daily. 02/15/20   Shamleffer, Konrad Dolores, MD  methimazole (TAPAZOLE) 5 MG tablet Take 2 tablets (10 mg total) by mouth daily. 02/15/20   Shamleffer, Konrad Dolores, MD    Family History Family History  Problem Relation Age of Onset  . Healthy Mother   . Healthy Father   . Healthy Maternal Grandmother   . Thyroid disease Maternal Grandmother   . Healthy Maternal Grandfather   . Healthy Paternal Grandmother   . Healthy Paternal Grandfather     Social History Social History   Tobacco Use  . Smoking status: Never Smoker  . Smokeless tobacco: Never Used  Vaping Use  . Vaping Use: Never used  Substance Use Topics  . Alcohol use: No  . Drug use:  No     Allergies   Patient has no known allergies.   Review of Systems Review of Systems  Constitutional: Negative for chills and fever.  HENT: Positive for congestion. Negative for postnasal drip and sore throat.   Eyes: Negative.   Respiratory: Positive for cough. Negative for shortness of breath.   Gastrointestinal: Negative for abdominal pain, nausea and vomiting.  Endocrine: Negative.   Genitourinary: Negative for dysuria.  Musculoskeletal: Negative for back pain.  Skin: Negative for rash.  Allergic/Immunologic: Negative.   Neurological: Positive for headaches.  Hematological: Negative.   Psychiatric/Behavioral: Negative.   All other systems reviewed and are negative.    Physical Exam Triage Vital Signs ED Triage Vitals  Enc Vitals Group     BP 05/01/20 1501 119/70     Pulse Rate 05/01/20 1501 74     Resp 05/01/20 1500 16     Temp 05/01/20 1500 99.4 F (37.4 C)     Temp Source 05/01/20 1500 Oral     SpO2 05/01/20 1501 97 %     Weight --      Height --      Head Circumference --      Peak Flow --      Pain Score --      Pain Loc --      Pain Edu? --  Excl. in GC? --    No data found.  Updated Vital Signs BP 119/70   Pulse 74   Temp 99.4 F (37.4 C) (Oral)   Resp 16   SpO2 97%   Visual Acuity Right Eye Distance:   Left Eye Distance:   Bilateral Distance:    Right Eye Near:   Left Eye Near:    Bilateral Near:     Physical Exam Vitals and nursing note reviewed.  Constitutional:      General: He is not in acute distress.    Appearance: He is well-developed. He is not ill-appearing or toxic-appearing.  HENT:     Head: Normocephalic.     Right Ear: Tympanic membrane is retracted.     Left Ear: Tympanic membrane is retracted.     Nose: Mucosal edema present.     Mouth/Throat:     Pharynx: Uvula midline.  Eyes:     General: Lids are normal.     Conjunctiva/sclera: Conjunctivae normal.     Pupils: Pupils are equal, round, and reactive to  light.  Cardiovascular:     Rate and Rhythm: Normal rate and regular rhythm.     Heart sounds: Normal heart sounds.  Pulmonary:     Effort: Pulmonary effort is normal. No respiratory distress.     Breath sounds: Normal breath sounds. No decreased breath sounds or wheezing.  Abdominal:     General: There is no distension.     Palpations: Abdomen is soft.  Musculoskeletal:        General: Normal range of motion.     Cervical back: Normal range of motion.  Skin:    General: Skin is warm and dry.     Findings: No rash.  Neurological:     Mental Status: He is alert and oriented to person, place, and time.     GCS: GCS eye subscore is 4. GCS verbal subscore is 5. GCS motor subscore is 6.     Cranial Nerves: No cranial nerve deficit.     Sensory: No sensory deficit.  Psychiatric:        Speech: Speech normal.        Behavior: Behavior normal. Behavior is cooperative.      UC Treatments / Results  Labs (all labs ordered are listed, but only abnormal results are displayed) Labs Reviewed  SARS CORONAVIRUS 2 (TAT 6-24 HRS)    EKG   Radiology No results found.  Procedures Procedures (including critical care time)  Medications Ordered in UC Medications - No data to display  Initial Impression / Assessment and Plan / UC Course  I have reviewed the triage vital signs and the nursing notes.  Pertinent labs & imaging results that were available during my care of the patient were reviewed by me and considered in my medical decision making (see chart for details).    Self quarantine, awaiting covid results, OTC management of symptoms, work note given. Final Clinical Impressions(s) / UC Diagnoses   Final diagnoses:  Nonspecific syndrome suggestive of viral illness     Discharge Instructions     Rest,push fluids, alternate tylenol/ibuprofen as label directed.     ED Prescriptions    None     I have reviewed the PDMP during this encounter.   Clancy Gourd,  NP 05/01/20 1641

## 2020-05-01 NOTE — Discharge Instructions (Addendum)
Rest,push fluids, alternate tylenol/ibuprofen as label directed.

## 2020-05-01 NOTE — ED Triage Notes (Signed)
Pt c/o cough, fever, headache chills since yesterday. Taking Dayquil and Nyquil at home. Pt is not COVID vaccinated, unknown sick exposures

## 2020-05-02 LAB — SARS CORONAVIRUS 2 (TAT 6-24 HRS): SARS Coronavirus 2: POSITIVE — AB

## 2020-05-04 ENCOUNTER — Telehealth: Payer: Self-pay | Admitting: Adult Health

## 2020-05-04 NOTE — Telephone Encounter (Signed)
Called and LMOM regarding monoclonal antibody treatment for COVID 19 given to those who are at risk for complications and/or hospitalization of the virus.  Patient meets criteria based on: autoimmune condition and chronic lung condition  Call back number given: [phone]  My chart message: sent  Lillard Anes, NP

## 2020-05-08 ENCOUNTER — Ambulatory Visit: Payer: Managed Care, Other (non HMO) | Admitting: Family Medicine

## 2020-05-10 ENCOUNTER — Other Ambulatory Visit: Payer: Self-pay

## 2020-05-10 ENCOUNTER — Ambulatory Visit (HOSPITAL_COMMUNITY)
Admission: EM | Admit: 2020-05-10 | Discharge: 2020-05-10 | Disposition: A | Discharge status: Home / Self Care | Payer: Managed Care, Other (non HMO)

## 2020-05-30 ENCOUNTER — Ambulatory Visit (INDEPENDENT_AMBULATORY_CARE_PROVIDER_SITE_OTHER): Payer: Managed Care, Other (non HMO) | Admitting: Internal Medicine

## 2020-05-30 ENCOUNTER — Encounter: Payer: Self-pay | Admitting: Internal Medicine

## 2020-05-30 ENCOUNTER — Other Ambulatory Visit: Payer: Self-pay

## 2020-05-30 VITALS — BP 120/78 | HR 79 | Ht 69.0 in | Wt 158.6 lb

## 2020-05-30 DIAGNOSIS — E0500 Thyrotoxicosis with diffuse goiter without thyrotoxic crisis or storm: Secondary | ICD-10-CM

## 2020-05-30 DIAGNOSIS — E0590 Thyrotoxicosis, unspecified without thyrotoxic crisis or storm: Secondary | ICD-10-CM

## 2020-05-30 LAB — TSH: TSH: 3.81 mIU/L (ref 0.50–4.30)

## 2020-05-30 LAB — T4, FREE: Free T4: 1 ng/dL (ref 0.8–1.4)

## 2020-05-30 NOTE — Patient Instructions (Signed)
-   STOP Diltiazem ( Cardizem) - Continue Methimazole 5 mg daily

## 2020-05-30 NOTE — Progress Notes (Signed)
Name: Rodney [name]  MRN/ DOB: [id number], 11-20-2000    Age/ Sex: 19 y.o., male     PCP: Mliss Sax, MD   Reason for Endocrinology Evaluation: Hyperthyroidism     Initial Endocrinology Clinic Visit: 02/15/2020    PATIENT IDENTIFIER: Rodney [name] is a 19 y.o., male with a past medical history of Asthma and Hyperthyroidism He has followed with [facility] since 02/15/2020 for consultative assistance with management of his Hyperthyroidism.   HISTORICAL SUMMARY: Pt was diagnosed with hyperthyroidism in 12/2019 with a suppressed TSH of < 0.01 uIU/mL and elevated FT4 at > 5.50 ng/dL and elevated FT3 at 50.5 pg/mL during evaluation of asthma attack. He was noted to have thyromegaly at the time.    On his initial presentation to our clinic he was on Metoprolol , but we switched to Cardizem and started methimazole   Maternal Grandmother and aunt with thyroid disease  SUBJECTIVE:    Today (05/30/2020):  Rodney [name] is here for a follow up on hyperthyroidism.  Pt has been noted with weight gain  Denies vomiting or abdominal pain nor diarrhea  Denies sob  Denies eye symptoms Has noted decrease in the size of his thyroid.     Medications Cardizem 120 mg daily  Methimazole 5 mg daily      HISTORY:  Past Medical History:  Past Medical History:  Diagnosis Date  . 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 Asthma   . Tachycardia   . Thyroid disease     Past Surgical History: No past surgical history on file.  Social History:  reports that he has never smoked. He has never used smokeless tobacco. He reports that he does not drink alcohol and does not use drugs. Family History:  Family History  Problem Relation Age of Onset  . Healthy Mother   . Healthy Father   . Healthy Maternal Grandmother   . Thyroid disease Maternal Grandmother   . Healthy Maternal Grandfather   . Healthy Paternal Grandmother   . Healthy Paternal  Grandfather      HOME MEDICATIONS: Allergies as of 05/30/2020   No Known Allergies     Medication List       Accurate as of May 30, 2020  3:11 PM. If you have any questions, ask your nurse or doctor.        albuterol 108 (90 Base) MCG/ACT inhaler Commonly known as: VENTOLIN HFA Inhale 2 puffs into the lungs every 6 (six) hours as needed for wheezing or shortness of breath.   diltiazem 120 MG 24 hr capsule Commonly known as: Cardizem CD Take 1 capsule (120 mg total) by mouth daily.   methimazole 5 MG tablet Commonly known as: TAPAZOLE Take 2 tablets (10 mg total) by mouth daily.         OBJECTIVE:   PHYSICAL EXAM: VS: BP 120/78 (BP Location: Left Arm, Patient Position: Sitting, Cuff Size: Normal)   Pulse 79   Ht 5\' 9"  (1.753 m)   Wt 158 lb 9.6 oz (71.9 kg)   SpO2 97%   BMI 23.42 kg/m    EXAM: General: Pt appears well and is in NAD  Neck: General: Supple without adenopathy. Thyroid: Thyroid size is prominent   No goiter or nodules appreciated.  Lungs: Clear with good BS bilat with no rales, rhonchi, or wheezes  Heart: Auscultation: RRR.  Abdomen: Normoactive bowel sounds, soft, nontender, without masses or organomegaly palpable  Extremities:  BL LE: No pretibial  edema normal ROM and strength.  Mental Status: Judgment, insight: Intact Mood and affect: No depression, anxiety, or agitation     DATA REVIEWED: Results for Rodney, [name] (MRN [id number]) as of 06/01/2020 14:21  Ref. Range 05/30/2020 15:31  TSH Latest Ref Range: 0.50 - 4.30 mIU/L 3.81  T4,Free(Direct) Latest Ref Range: 0.8 - 1.4 ng/dL 1.0    TSI <250 % baseline 314High       ASSESSMENT / PLAN / RECOMMENDATIONS:   1. Hyperthyroidism Secondary to Graves' Disease:  - Pt is clinically and biochemically euthyroid  - He is tolerating methimazole will. With a TSH in the upper end of normal. Will reduce methimazole as below     Medications  Decrease Methimazole 5 mg, Half a  tablet daily  STOP Diltiazem    2. Graves' Disease:    - No extrathyroidal manifestations of thyroid disease.  - Pt encouraged to have regular eye exams and to notify his ophthalmologist of a diagnosis of Grave's disease. We discussed S/S of grave's orbitopathy       F/U in 4 months  Labs in 8 weeks    Signed electronically by: Lyndle Herrlich, MD  [facility]  [facility] [address] [place] [place], [place] [address] Phone: [phone] FAX: [phone]      CC: Mliss Sax, MD [address] [place] [address] Phone: [phone]  Fax: [phone]   Return to Endocrinology clinic as below: Future Appointments  Date Time Provider Department Center  06/05/2020  3:00 PM Mliss Sax, MD [id number]

## 2020-06-01 MED ORDER — METHIMAZOLE 5 MG PO TABS: 2.5000 mg | ORAL_TABLET | Freq: Every day | ORAL | 6 refills | Status: DC

## 2020-06-05 ENCOUNTER — Encounter: Payer: Self-pay | Admitting: Family Medicine

## 2020-06-05 ENCOUNTER — Other Ambulatory Visit: Payer: Self-pay

## 2020-06-05 ENCOUNTER — Ambulatory Visit (INDEPENDENT_AMBULATORY_CARE_PROVIDER_SITE_OTHER): Payer: Managed Care, Other (non HMO) | Admitting: Family Medicine

## 2020-06-05 VITALS — BP 122/76 | HR 91 | Temp 98.4°F | Ht 69.0 in | Wt 156.3 lb

## 2020-06-05 DIAGNOSIS — T887XXA Unspecified adverse effect of drug or medicament, initial encounter: Secondary | ICD-10-CM | POA: Diagnosis not present

## 2020-06-05 DIAGNOSIS — Z0000 Encounter for general adult medical examination without abnormal findings: Secondary | ICD-10-CM

## 2020-06-05 DIAGNOSIS — E0590 Thyrotoxicosis, unspecified without thyrotoxic crisis or storm: Secondary | ICD-10-CM

## 2020-06-05 NOTE — Progress Notes (Signed)
Established Patient Office Visit  Subjective:  Patient ID: Rodney [name], male    DOB: [birth year]  Age: 19 y.o. MRN: [id number]  CC:  Chief Complaint  Patient presents with  . Follow-up    3 month follow up, no concerns.     HPI Rodney [name] presents for follow-up of hyperthyroidism, tachycardia.  Tachycardia has resolved status post ongoing treatment with methimazole for hyperthyroidism.  Fatigue has resolved.  Denies diplopia.  Status post Covid infection 3 weeks ago.  He is fully recovered.  There was no alteration in his taste or smell.  Past Medical History:  Diagnosis Date  . 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 Asthma   . Tachycardia   . Thyroid disease     History reviewed. No pertinent surgical history.  Family History  Problem Relation Age of Onset  . Healthy Mother   . Healthy Father   . Healthy Maternal Grandmother   . Thyroid disease Maternal Grandmother   . Healthy Maternal Grandfather   . Healthy Paternal Grandmother   . Healthy Paternal Grandfather     Social History   Socioeconomic History  . Marital status: Single    Spouse name: Not on file  . Number of children: Not on file  . Years of education: Not on file  . Highest education level: Not on file  Occupational History  . Not on file  Tobacco Use  . Smoking status: Never Smoker  . Smokeless tobacco: Never Used  Vaping Use  . Vaping Use: Never used  Substance and Sexual Activity  . Alcohol use: No  . Drug use: No  . Sexual activity: Yes  Other Topics Concern  . Not on file  Social History Narrative  . Not on file   Social Determinants of Health   Financial Resource Strain:   . Difficulty of Paying Living Expenses: Not on file  Food Insecurity:   . Worried About Programme researcher, broadcasting/film/video in the Last Year: Not on file  . Ran Out of Food in the Last Year: Not on file  Transportation Needs:   . Lack of Transportation (Medical): Not on file  . Lack of Transportation  (Non-Medical): Not on file  Physical Activity:   . Days of Exercise per Week: Not on file  . Minutes of Exercise per Session: Not on file  Stress:   . Feeling of Stress : Not on file  Social Connections:   . Frequency of Communication with Friends and Family: Not on file  . Frequency of Social Gatherings with Friends and Family: Not on file  . Attends Religious Services: Not on file  . Active Member of Clubs or Organizations: Not on file  . Attends Banker Meetings: Not on file  . Marital Status: Not on file  Intimate Partner Violence:   . Fear of Current or Ex-Partner: Not on file  . Emotionally Abused: Not on file  . Physically Abused: Not on file  . Sexually Abused: Not on file    Outpatient Medications Prior to Visit  Medication Sig Dispense Refill  . albuterol (VENTOLIN HFA) 108 (90 Base) MCG/ACT inhaler Inhale 2 puffs into the lungs every 6 (six) hours as needed for wheezing or shortness of breath. 18 g 11  . methimazole (TAPAZOLE) 5 MG tablet Take 0.5 tablets (2.5 mg total) by mouth daily. 3 tablet 6   No facility-administered medications prior to visit.    No Known Allergies  ROS Review of Systems  Constitutional: Negative.  Negative for unexpected weight change.  HENT: Negative.   Eyes: Negative for photophobia and visual disturbance.  Respiratory: Negative.   Cardiovascular: Negative.  Negative for chest pain and palpitations.  Gastrointestinal: Negative.  Negative for diarrhea.  Endocrine: Negative for cold intolerance and heat intolerance.  Genitourinary: Negative.   Musculoskeletal: Negative for gait problem and joint swelling.  Allergic/Immunologic: Negative for immunocompromised state.  Hematological: Does not bruise/bleed easily.      Objective:    Physical Exam Vitals and nursing note reviewed.  Constitutional:      General: He is not in acute distress.    Appearance: Normal appearance. He is normal weight. He is not ill-appearing,  toxic-appearing or diaphoretic.  HENT:     Head: Normocephalic and atraumatic.     Right Ear: Tympanic membrane, ear canal and external ear normal.     Left Ear: Tympanic membrane, ear canal and external ear normal.     Mouth/Throat:     Mouth: Mucous membranes are moist.     Pharynx: Oropharynx is clear. No oropharyngeal exudate or posterior oropharyngeal erythema.  Eyes:     General: No scleral icterus.       Right eye: No discharge.        Left eye: No discharge.     Extraocular Movements: Extraocular movements intact.     Conjunctiva/sclera: Conjunctivae normal.     Pupils: Pupils are equal, round, and reactive to light.  Cardiovascular:     Rate and Rhythm: Normal rate and regular rhythm.     Heart sounds: Normal heart sounds.  Pulmonary:     Effort: Pulmonary effort is normal.     Breath sounds: Normal breath sounds.  Abdominal:     General: Bowel sounds are normal.  Musculoskeletal:     Right lower leg: No edema.     Left lower leg: No edema.  Skin:    General: Skin is warm and dry.  Neurological:     Mental Status: He is alert and oriented to person, place, and time.  Psychiatric:        Mood and Affect: Mood normal.        Behavior: Behavior normal.     BP 122/76   Pulse 91   Temp 98.4 F (36.9 C) (Tympanic)   Ht 5\' 9"  (1.753 m)   Wt 156 lb 4.8 oz (70.9 kg)   SpO2 97%   BMI 23.08 kg/m  Wt Readings from Last 3 Encounters:  06/05/20 156 lb 4.8 oz (70.9 kg) (51 %, Z= 0.03)*  05/30/20 158 lb 9.6 oz (71.9 kg) (55 %, Z= 0.12)*  02/15/20 146 lb 3.2 oz (66.3 kg) (36 %, Z= -0.37)*   * Growth percentiles are based on CDC (Boys, 2-20 Years) data.     Health Maintenance Due  Topic Date Due  . Hepatitis C Screening  Never done  . HIV Screening  Never done  . INFLUENZA VACCINE  Never done    There are no preventive care reminders to display for this patient.  Lab Results  Component Value Date   TSH 3.81 05/30/2020   Lab Results  Component Value Date    WBC 5.4 01/21/2020   HGB 13.2 01/21/2020   HCT 40.7 01/21/2020   MCV 84.8 01/21/2020   PLT 242 01/21/2020   Lab Results  Component Value Date   NA 137 01/21/2020   K 4.2 01/21/2020   CO2 25 01/21/2020   GLUCOSE 116 (H) 01/21/2020  BUN 20 01/21/2020   CREATININE 0.61 01/21/2020   CALCIUM 9.7 01/21/2020   ANIONGAP 9 01/21/2020   No results found for: CHOL No results found for: HDL No results found for: LDLCALC No results found for: TRIG No results found for: CHOLHDL No results found for: QQVZ5G    Assessment & Plan:   Problem List Items Addressed This Visit      Endocrine   Hyperthyroidism - Primary   Relevant Orders   Ambulatory referral to Ophthalmology    Other Visit Diagnoses    Healthcare maintenance       Relevant Orders   Ambulatory referral to Ophthalmology   Medication side effect       Relevant Orders   CBC      No orders of the defined types were placed in this encounter.   Follow-up: Return in about 6 months (around 12/03/2020), or have covid vaccine in next month or so..   Stressed the importance of compliance with methimazole and regular follow-up and regular CBCs. Mliss Sax, MD

## 2020-06-06 ENCOUNTER — Other Ambulatory Visit: Payer: Self-pay | Admitting: Internal Medicine

## 2020-06-06 LAB — CBC
HCT: 41.5 % (ref 36.0–49.0)
Hemoglobin: 14 g/dL (ref 12.0–16.0)
MCHC: 33.6 g/dL (ref 31.0–37.0)
MCV: 88.3 fl (ref 78.0–98.0)
Platelets: 227 10*3/uL (ref 150.0–575.0)
RBC: 4.71 Mil/uL (ref 3.80–5.70)
RDW: 15.3 % (ref 11.4–15.5)
WBC: 5.9 10*3/uL (ref 4.5–13.5)

## 2020-06-06 MED ORDER — METHIMAZOLE 5 MG PO TABS: 2.5000 mg | ORAL_TABLET | Freq: Every day | ORAL | 6 refills | Status: DC

## 2020-07-25 ENCOUNTER — Other Ambulatory Visit (INDEPENDENT_AMBULATORY_CARE_PROVIDER_SITE_OTHER): Payer: Managed Care, Other (non HMO)

## 2020-07-25 ENCOUNTER — Other Ambulatory Visit: Payer: Self-pay

## 2020-07-25 DIAGNOSIS — E0590 Thyrotoxicosis, unspecified without thyrotoxic crisis or storm: Secondary | ICD-10-CM | POA: Diagnosis not present

## 2020-07-25 LAB — TSH: TSH: <0.01 u[IU]/mL — ABNORMAL LOW (ref 0.35–5.50)

## 2020-07-25 LAB — T4, FREE: Free T4: 1.63 ng/dL — ABNORMAL HIGH (ref 0.60–1.60)

## 2020-10-10 ENCOUNTER — Ambulatory Visit (INDEPENDENT_AMBULATORY_CARE_PROVIDER_SITE_OTHER): Payer: Managed Care, Other (non HMO) | Admitting: Internal Medicine

## 2020-10-10 ENCOUNTER — Encounter: Payer: Self-pay | Admitting: Internal Medicine

## 2020-10-10 ENCOUNTER — Other Ambulatory Visit: Payer: Self-pay

## 2020-10-10 VITALS — BP 120/72 | HR 80 | Ht 69.0 in | Wt 152.5 lb

## 2020-10-10 DIAGNOSIS — E0500 Thyrotoxicosis with diffuse goiter without thyrotoxic crisis or storm: Secondary | ICD-10-CM | POA: Diagnosis not present

## 2020-10-10 DIAGNOSIS — E0590 Thyrotoxicosis, unspecified without thyrotoxic crisis or storm: Secondary | ICD-10-CM

## 2020-10-10 LAB — COMPREHENSIVE METABOLIC PANEL
ALT: 15 U/L (ref 0–53)
AST: 17 U/L (ref 0–37)
Albumin: 4.9 g/dL (ref 3.5–5.2)
Alkaline Phosphatase: 103 U/L (ref 39–117)
BUN: 18 mg/dL (ref 6–23)
CO2: 28 mEq/L (ref 19–32)
Calcium: 9.6 mg/dL (ref 8.4–10.5)
Chloride: 103 mEq/L (ref 96–112)
Creatinine, Ser: 0.89 mg/dL (ref 0.40–1.50)
GFR: 123.51 mL/min (ref >60.00)
Glucose, Bld: 93 mg/dL (ref 70–99)
Potassium: 3.8 mEq/L (ref 3.5–5.1)
Sodium: 135 mEq/L (ref 135–145)
Total Bilirubin: 1.2 mg/dL (ref 0.2–1.2)
Total Protein: 7.4 g/dL (ref 6.0–8.3)

## 2020-10-10 LAB — CBC WITH DIFFERENTIAL/PLATELET
Basophils Absolute: 0 10*3/uL (ref 0.0–0.1)
Basophils Relative: 0.9 % (ref 0.0–3.0)
Eosinophils Absolute: 0.2 10*3/uL (ref 0.0–0.7)
Eosinophils Relative: 3.6 % (ref 0.0–5.0)
HCT: 42.7 % (ref 39.0–52.0)
Hemoglobin: 14.8 g/dL (ref 13.0–17.0)
Lymphocytes Relative: 46.6 % — ABNORMAL HIGH (ref 12.0–46.0)
Lymphs Abs: 2.2 10*3/uL (ref 0.7–4.0)
MCHC: 34.6 g/dL (ref 30.0–36.0)
MCV: 85.4 fl (ref 78.0–100.0)
Monocytes Absolute: 0.4 10*3/uL (ref 0.1–1.0)
Monocytes Relative: 7.5 % (ref 3.0–12.0)
Neutro Abs: 2 10*3/uL (ref 1.4–7.7)
Neutrophils Relative %: 41.4 % — ABNORMAL LOW (ref 43.0–77.0)
Platelets: 223 10*3/uL (ref 150.0–400.0)
RBC: 5 Mil/uL (ref 4.22–5.81)
RDW: 12.1 % (ref 11.5–14.6)
WBC: 4.7 10*3/uL (ref 4.5–10.5)

## 2020-10-10 LAB — TSH: TSH: <0.01 u[IU]/mL — ABNORMAL LOW (ref 0.35–5.50)

## 2020-10-10 LAB — T4, FREE: Free T4: 2.28 ng/dL — ABNORMAL HIGH (ref 0.60–1.60)

## 2020-10-10 NOTE — Patient Instructions (Addendum)
-   Continue Methimazole 5 mg daily for now    -

## 2020-10-10 NOTE — Progress Notes (Signed)
Name: Rodney [name]  MRN/ DOB: [id number], [birth year]    Age/ Sex: 20 y.o., male     PCP: Mliss Sax, MD   Reason for Endocrinology Evaluation: Hyperthyroidism     Initial Endocrinology Clinic Visit: 02/15/2020    PATIENT IDENTIFIER: Rodney [name] is a 20 y.o., male with a past medical history of Asthma and Hyperthyroidism He has followed with [facility] since 02/15/2020 for consultative assistance with management of his Hyperthyroidism.   HISTORICAL SUMMARY: Pt was diagnosed with hyperthyroidism in 12/2019 with a suppressed TSH of < 0.01 uIU/mL and elevated FT4 at > 5.50 ng/dL and elevated FT3 at 43.3 pg/mL during evaluation of asthma attack. He was noted to have thyromegaly at the time.    On his initial presentation to our clinic he was on Metoprolol , but we switched to Cardizem and started methimazole   Maternal Grandmother and aunt with thyroid disease  SUBJECTIVE:    Today (10/10/2020):  Rodney [name] is here for a follow up on hyperthyroidism. On his last visit here with reduced his Methimazole from 1 tablet to half a tablet and developed hyperthyroidism again, so we increased it back again to 1 tablet daily   Pt has been noted stable weight  Denies vomiting or abdominal pain nor diarrhea  Denies palpitations  Denies eye symptoms Denies local neck symptoms     Medications Methimazole 5 mg daily      HISTORY:  Past Medical History:  Past Medical History:  Diagnosis Date  . 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 Asthma   . Tachycardia   . Thyroid disease    Past Surgical History: No past surgical history on file. Social History:  reports that he has never smoked. He has never used smokeless tobacco. He reports that he does not drink alcohol and does not use drugs. Family History:  Family History  Problem Relation Age of Onset  . Healthy Mother   . Healthy Father   . Healthy Maternal Grandmother   .  Thyroid disease Maternal Grandmother   . Healthy Maternal Grandfather   . Healthy Paternal Grandmother   . Healthy Paternal Grandfather      HOME MEDICATIONS: Allergies as of 10/10/2020   No Known Allergies     Medication List       Accurate as of October 10, 2020  3:31 PM. If you have any questions, ask your nurse or doctor.        albuterol 108 (90 Base) MCG/ACT inhaler Commonly known as: VENTOLIN HFA Inhale 2 puffs into the lungs every 6 (six) hours as needed for wheezing or shortness of breath.   methimazole 5 MG tablet Commonly known as: TAPAZOLE Take 0.5 tablets (2.5 mg total) by mouth daily. What changed: how much to take         OBJECTIVE:   PHYSICAL EXAM: VS: BP 120/72   Pulse 80   Ht 5\' 9"  (1.753 m)   Wt 152 lb 8 oz (69.2 kg)   SpO2 98%   BMI 22.52 kg/m    EXAM: General: Pt appears well and is in NAD  Neck: General: Supple without adenopathy. Thyroid: Thyroid size is 60 grams.  No goiter or nodules appreciated.No bruits  Lungs: Clear with good BS bilat with no rales, rhonchi, or wheezes  Heart: Auscultation: RRR.  Abdomen: Normoactive bowel sounds, soft, nontender, without masses or organomegaly palpable  Extremities:  BL LE: No pretibial edema normal ROM and strength.  Mental  Status: Judgment, insight: Intact Mood and affect: No depression, anxiety, or agitation     DATA REVIEWED:  Results for ADVAITH, VAYNSHTEYN (MRN [id number]) as of 10/13/2020 08:00  Ref. Range 10/10/2020 15:38  Sodium Latest Ref Range: 135 - 145 mEq/L 135  Potassium Latest Ref Range: 3.5 - 5.1 mEq/L 3.8  Chloride Latest Ref Range: 96 - 112 mEq/L 103  CO2 Latest Ref Range: 19 - 32 mEq/L 28  Glucose Latest Ref Range: 70 - 99 mg/dL 93  BUN Latest Ref Range: 6 - 23 mg/dL 18  Creatinine Latest Ref Range: 0.40 - 1.50 mg/dL 4.09  Calcium Latest Ref Range: 8.4 - 10.5 mg/dL 9.6  Alkaline Phosphatase Latest Ref Range: 39 - 117 U/L 103  Albumin Latest Ref Range: 3.5 - 5.2  g/dL 4.9  AST Latest Ref Range: 0 - 37 U/L 17  ALT Latest Ref Range: 0 - 53 U/L 15  Total Protein Latest Ref Range: 6.0 - 8.3 g/dL 7.4  Total Bilirubin Latest Ref Range: 0.2 - 1.2 mg/dL 1.2  GFR Latest Ref Range: >60.00 mL/min 123.51  WBC Latest Ref Range: 4.5 - 10.5 K/uL 4.7  RBC Latest Ref Range: 4.22 - 5.81 Mil/uL 5.00  Hemoglobin Latest Ref Range: 13.0 - 17.0 g/dL 81.1  HCT Latest Ref Range: 39.0 - 52.0 % 42.7  MCV Latest Ref Range: 78.0 - 100.0 fl 85.4  MCHC Latest Ref Range: 30.0 - 36.0 g/dL 91.4  RDW Latest Ref Range: 11.5 - 14.6 % 12.1  Platelets Latest Ref Range: 150.0 - 400.0 K/uL 223.0  Neutrophils Latest Ref Range: 43.0 - 77.0 % 41.4 (L)  Lymphocytes Latest Ref Range: 12.0 - 46.0 % 46.6 (H)  Monocytes Relative Latest Ref Range: 3.0 - 12.0 % 7.5  Eosinophil Latest Ref Range: 0.0 - 5.0 % 3.6  Basophil Latest Ref Range: 0.0 - 3.0 % 0.9  NEUT# Latest Ref Range: 1.4 - 7.7 K/uL 2.0  Lymphocyte # Latest Ref Range: 0.7 - 4.0 K/uL 2.2  Monocyte # Latest Ref Range: 0.1 - 1.0 K/uL 0.4  Eosinophils Absolute Latest Ref Range: 0.0 - 0.7 K/uL 0.2  Basophils Absolute Latest Ref Range: 0.0 - 0.1 K/uL 0.0  TSH Latest Ref Range: 0.35 - 5.50 uIU/mL <0.01 (L)  T4,Free(Direct) Latest Ref Range: 0.60 - 1.60 ng/dL 7.82 (H)      TSI <956 % baseline 314High       ASSESSMENT / PLAN / RECOMMENDATIONS:   Hyperthyroidism Secondary to Graves' Disease:  - Pt is clinically  Euthyroid but unfortunately biochemically he continues to be hyperthyroid - There's questionable consistent intake of methimazole   -Will increase as below  - I have advised him to make sure to verify the milligram of his tabs    Medications   Increase Methimazole 5 mg, 3 tablet daily     2. Graves' Disease:    - No extrathyroidal manifestations of thyroid disease.      F/U in 4 months     Signed electronically by: Lyndle Herrlich, MD  [facility]  [facility] [address] [place] [place], [place] [address] Phone: [phone] FAX: [phone]      CC: Mliss Sax, MD [address] [place] [address] Phone: [phone]  Fax: [phone]   Return to Endocrinology clinic as below: Future Appointments  Date Time Provider Department Center  12/04/2020  3:30 PM Mliss Sax, MD [id number]

## 2020-10-13 ENCOUNTER — Encounter: Payer: Self-pay | Admitting: Internal Medicine

## 2020-10-13 MED ORDER — METHIMAZOLE 5 MG PO TABS: 15.0000 mg | ORAL_TABLET | Freq: Every day | ORAL | 6 refills | Status: DC

## 2020-12-04 ENCOUNTER — Ambulatory Visit: Payer: Managed Care, Other (non HMO) | Admitting: Family Medicine

## 2021-01-15 ENCOUNTER — Encounter: Payer: Self-pay | Admitting: Family Medicine

## 2021-01-15 ENCOUNTER — Other Ambulatory Visit: Payer: Self-pay

## 2021-01-15 ENCOUNTER — Ambulatory Visit (INDEPENDENT_AMBULATORY_CARE_PROVIDER_SITE_OTHER): Payer: Managed Care, Other (non HMO) | Admitting: Family Medicine

## 2021-01-15 VITALS — BP 124/70 | HR 101 | Temp 98.2°F | Ht 69.0 in | Wt 155.6 lb

## 2021-01-15 DIAGNOSIS — E010 Iodine-deficiency related diffuse (endemic) goiter: Secondary | ICD-10-CM

## 2021-01-15 DIAGNOSIS — Z0000 Encounter for general adult medical examination without abnormal findings: Secondary | ICD-10-CM | POA: Insufficient documentation

## 2021-01-15 DIAGNOSIS — E0590 Thyrotoxicosis, unspecified without thyrotoxic crisis or storm: Secondary | ICD-10-CM | POA: Diagnosis not present

## 2021-01-15 NOTE — Patient Instructions (Signed)
Preventive Care 18-21 Years Old, Male Preventive care refers to lifestyle choices and visits with your health care provider that can promote health and wellness. At this stage in your life, you may start seeing a primary care physician instead of a pediatrician. It is important to take responsibility for your health and well-being. Preventive care for young adults includes:  A yearly physical exam. This is also called an annual wellness visit.  Regular dental and eye exams.  Immunizations.  Screening for certain conditions.  Healthy lifestyle choices, such as: ? Eating a healthy diet. ? Getting regular exercise. ? Not using drugs or products that contain nicotine and tobacco. ? Limiting alcohol use. What can I expect for my preventive care visit? Physical exam Your health care provider may check your:  Height and weight. These may be used to calculate your BMI (body mass index). BMI is a measurement that tells if you are at a healthy weight.  Heart rate and blood pressure.  Body temperature.  Skin for abnormal spots. Counseling Your health care provider may ask you questions about your:  Past medical problems.  Family's medical history.  Alcohol, tobacco, and drug use.  Home life and relationship well-being.  Access to firearms.  Emotional well-being.  Diet, exercise, and sleep habits.  Sexual activity and sexual health. What immunizations do I need? Vaccines are usually given at various ages, according to a schedule. Your health care provider will recommend vaccines for you based on your age, medical history, and lifestyle or other factors, such as travel or where you work.   What tests do I need? Blood tests  Lipid and cholesterol levels. These may be checked every 5 years starting at age 20.  Hepatitis C test.  Hepatitis B test. Screening  Genital exam to check for testicular cancer or hernias.  STD (sexually transmitted disease) testing, if you are at  risk. Other tests  Tuberculosis skin test.  Vision and hearing tests.  Skin exam. Talk with your health care provider about your test results, treatment options, and if necessary, the need for more tests. Follow these instructions at home: Eating and drinking  Eat a healthy diet that includes fresh fruits and vegetables, whole grains, lean protein, and low-fat dairy products.  Drink enough fluid to keep your urine pale yellow.  Do not drink alcohol if: ? Your health care provider tells you not to drink. ? You are under the legal drinking age. In the [place] the legal drinking age is 21.  If you drink alcohol: ? Limit how much you use to 0-2 drinks a day. ? Be aware of how much alcohol is in your drink. In the [place] one drink equals one 12 oz bottle of beer (355 mL), one 5 oz glass of wine (148 mL), or one 1 oz glass of hard liquor (44 mL).   Lifestyle  Take daily care of your teeth and gums. Brush your teeth every morning and night with fluoride toothpaste. Floss one time each day.  Stay active. Exercise for at least 30 minutes 5 or more days of the week.  Do not use any products that contain nicotine or tobacco, such as cigarettes, e-cigarettes, and chewing tobacco. If you need help quitting, ask your health care provider.  Do not use drugs.  If you are sexually active, practice safe sex. Use a condom or other form of protection to prevent STIs (sexually transmitted infections).  Find healthy ways to cope with stress, such as: ? Meditation, yoga,   or listening to music. ? Journaling. ? Talking to a trusted person. ? Spending time with friends and family. Safety  Always wear your seat belt while driving or riding in a vehicle.  Do not drive: ? If you have been drinking alcohol. Do not ride with someone who has been drinking. ? When you are tired or distracted. ? While texting.  Wear a helmet and other protective equipment during sports activities.  If you have  firearms in your house, make sure you follow all gun safety procedures.  Seek help if you have been bullied, physically abused, or sexually abused.  Use the Internet responsibly to avoid dangers, such as online bullying and online sex predators. What's next?  Go to your health care provider once a year for an annual wellness visit.  Ask your health care provider how often you should have your eyes and teeth checked.  Stay up to date on all vaccines. This information is not intended to replace advice given to you by your health care provider. Make sure you discuss any questions you have with your health care provider. Document Revised: 06/06/2019 Document Reviewed: 09/14/2018 Elsevier Patient Education  2021 [place] Maintenance, Male Adopting a healthy lifestyle and getting preventive care are important in promoting health and wellness. Ask your health care provider about:  The right schedule for you to have regular tests and exams.  Things you can do on your own to prevent diseases and keep yourself healthy. What should I know about diet, weight, and exercise? Eat a healthy diet  Eat a diet that includes plenty of vegetables, fruits, low-fat dairy products, and lean protein.  Do not eat a lot of foods that are high in solid fats, added sugars, or sodium.   Maintain a healthy weight Body mass index (BMI) is a measurement that can be used to identify possible weight problems. It estimates body fat based on height and weight. Your health care provider can help determine your BMI and help you achieve or maintain a healthy weight. Get regular exercise Get regular exercise. This is one of the most important things you can do for your health. Most adults should:  Exercise for at least 150 minutes each week. The exercise should increase your heart rate and make you sweat (moderate-intensity exercise).  Do strengthening exercises at least twice a week. This is in addition to  the moderate-intensity exercise.  Spend less time sitting. Even light physical activity can be beneficial. Watch cholesterol and blood lipids Have your blood tested for lipids and cholesterol at 21 years of age, then have this test every 5 years. You may need to have your cholesterol levels checked more often if:  Your lipid or cholesterol levels are high.  You are older than 21 years of age.  You are at high risk for heart disease. What should I know about cancer screening? Many types of cancers can be detected early and may often be prevented. Depending on your health history and family history, you may need to have cancer screening at various ages. This may include screening for:  Colorectal cancer.  Prostate cancer.  Skin cancer.  Lung cancer. What should I know about heart disease, diabetes, and high blood pressure? Blood pressure and heart disease  High blood pressure causes heart disease and increases the risk of stroke. This is more likely to develop in people who have high blood pressure readings, are of African descent, or are overweight.  Talk with your health  care provider about your target blood pressure readings.  Have your blood pressure checked: ? Every 3-5 years if you are 41-85 years of age. ? Every year if you are 77 years old or older.  If you are between the ages of 61 and 72 and are a current or former smoker, ask your health care provider if you should have a one-time screening for abdominal aortic aneurysm (AAA). Diabetes Have regular diabetes screenings. This checks your fasting blood sugar level. Have the screening done:  Once every three years after age 82 if you are at a normal weight and have a low risk for diabetes.  More often and at a younger age if you are overweight or have a high risk for diabetes. What should I know about preventing infection? Hepatitis B If you have a higher risk for hepatitis B, you should be screened for this virus.  Talk with your health care provider to find out if you are at risk for hepatitis B infection. Hepatitis C Blood testing is recommended for:  Everyone born from [birth year] through 1966.  Anyone with known risk factors for hepatitis C. Sexually transmitted infections (STIs)  You should be screened each year for STIs, including gonorrhea and chlamydia, if: ? You are sexually active and are younger than 21 years of age. ? You are older than 21 years of age and your health care provider tells you that you are at risk for this type of infection. ? Your sexual activity has changed since you were last screened, and you are at increased risk for chlamydia or gonorrhea. Ask your health care provider if you are at risk.  Ask your health care provider about whether you are at high risk for HIV. Your health care provider may recommend a prescription medicine to help prevent HIV infection. If you choose to take medicine to prevent HIV, you should first get tested for HIV. You should then be tested every 3 months for as long as you are taking the medicine. Follow these instructions at home: Lifestyle  Do not use any products that contain nicotine or tobacco, such as cigarettes, e-cigarettes, and chewing tobacco. If you need help quitting, ask your health care provider.  Do not use street drugs.  Do not share needles.  Ask your health care provider for help if you need support or information about quitting drugs. Alcohol use  Do not drink alcohol if your health care provider tells you not to drink.  If you drink alcohol: ? Limit how much you have to 0-2 drinks a day. ? Be aware of how much alcohol is in your drink. In the [place] one drink equals one 12 oz bottle of beer (355 mL), one 5 oz glass of wine (148 mL), or one 1 oz glass of hard liquor (44 mL). General instructions  Schedule regular health, dental, and eye exams.  Stay current with your vaccines.  Tell your health care provider if: ? You  often feel depressed. ? You have ever been abused or do not feel safe at home. Summary  Adopting a healthy lifestyle and getting preventive care are important in promoting health and wellness.  Follow your health care provider's instructions about healthy diet, exercising, and getting tested or screened for diseases.  Follow your health care provider's instructions on monitoring your cholesterol and blood pressure. This information is not intended to replace advice given to you by your health care provider. Make sure you discuss any questions you have with your  health care provider. Document Revised: 09/13/2018 Document Reviewed: 09/13/2018 Elsevier Patient Education  2021 [employer].

## 2021-01-15 NOTE — Progress Notes (Signed)
Established Patient Office Visit  Subjective:  Patient ID: Rodney [name], male    DOB: 09/26/2001  Age: 20 y.o. MRN: [id number]  CC:  Chief Complaint  Patient presents with  . Follow-up    6 month follow up, patient would like referral to eye doctor.     HPI Rodney [name] presents for follow-up status post diagnosis of Graves' disease.  Slowly responding to methimazole.  Weight has been stable pulse rate is slightly elevated.  Vision has been normal.  He has had no problems taking the Tapazole.  Free T4 levels remain elevated but fortunately his CBC has remained normal.  Continues to work.  Is thinking about entering a trade school at [employer].  He does not smoke drink alcohol or use illicit drugs.  Past Medical History:  Diagnosis Date  . 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 Asthma   . Tachycardia   . Thyroid disease     History reviewed. No pertinent surgical history.  Family History  Problem Relation Age of Onset  . Healthy Mother   . Healthy Father   . Healthy Maternal Grandmother   . Thyroid disease Maternal Grandmother   . Healthy Maternal Grandfather   . Healthy Paternal Grandmother   . Healthy Paternal Grandfather     Social History   Socioeconomic History  . Marital status: Single    Spouse name: Not on file  . Number of children: Not on file  . Years of education: Not on file  . Highest education level: Not on file  Occupational History  . Not on file  Tobacco Use  . Smoking status: Never Smoker  . Smokeless tobacco: Never Used  Vaping Use  . Vaping Use: Never used  Substance and Sexual Activity  . Alcohol use: No  . Drug use: No  . Sexual activity: Yes  Other Topics Concern  . Not on file  Social History Narrative  . Not on file   Social Determinants of Health   Financial Resource Strain: Not on file  Food Insecurity: Not on file  Transportation Needs: Not on file  Physical Activity: Not on file  Stress: Not on file  Social  Connections: Not on file  Intimate Partner Violence: Not on file    Outpatient Medications Prior to Visit  Medication Sig Dispense Refill  . albuterol (VENTOLIN HFA) 108 (90 Base) MCG/ACT inhaler Inhale 2 puffs into the lungs every 6 (six) hours as needed for wheezing or shortness of breath. 18 g 11  . methimazole (TAPAZOLE) 5 MG tablet Take 3 tablets (15 mg total) by mouth daily. 90 tablet 6   No facility-administered medications prior to visit.    No Known Allergies  ROS Review of Systems  Constitutional: Negative.   HENT: Negative.   Respiratory: Negative.  Negative for chest tightness, shortness of breath and wheezing.   Cardiovascular: Negative.  Negative for chest pain and palpitations.  Gastrointestinal: Negative.   Endocrine: Negative for cold intolerance and heat intolerance.  Genitourinary: Negative.   Musculoskeletal: Negative for arthralgias and joint swelling.  Skin: Negative for color change and pallor.  Neurological: Negative for weakness.  Psychiatric/Behavioral: Negative.       Objective:    Physical Exam Vitals and nursing note reviewed.  Constitutional:      General: He is not in acute distress.    Appearance: Normal appearance. He is normal weight. He is not ill-appearing, toxic-appearing or diaphoretic.  HENT:     Head: Normocephalic  and atraumatic.     Right Ear: Tympanic membrane, ear canal and external ear normal.     Left Ear: Tympanic membrane, ear canal and external ear normal.     Mouth/Throat:     Mouth: Mucous membranes are moist.     Pharynx: Oropharynx is clear. No oropharyngeal exudate or posterior oropharyngeal erythema.  Eyes:     General: No scleral icterus.       Right eye: No discharge.        Left eye: No discharge.     Extraocular Movements: Extraocular movements intact.     Conjunctiva/sclera: Conjunctivae normal.     Pupils: Pupils are equal, round, and reactive to light.   Neck:     Thyroid: Thyromegaly present. No  thyroid tenderness.  Cardiovascular:     Rate and Rhythm: Normal rate and regular rhythm.  Pulmonary:     Effort: Pulmonary effort is normal.     Breath sounds: Normal breath sounds.  Abdominal:     General: Bowel sounds are normal.  Musculoskeletal:     Cervical back: No rigidity or tenderness.  Lymphadenopathy:     Cervical: No cervical adenopathy.  Skin:    General: Skin is warm and dry.  Neurological:     Mental Status: He is alert and oriented to person, place, and time.  Psychiatric:        Mood and Affect: Mood normal.        Behavior: Behavior normal.     BP 124/70   Pulse (!) 101   Temp 98.2 F (36.8 C) (Temporal)   Ht 5\' 9"  (1.753 m)   Wt 155 lb 9.6 oz (70.6 kg)   SpO2 97%   BMI 22.98 kg/m  Wt Readings from Last 3 Encounters:  01/15/21 155 lb 9.6 oz (70.6 kg)  10/10/20 152 lb 8 oz (69.2 kg)  06/05/20 156 lb 4.8 oz (70.9 kg) (51 %, Z= 0.03)*   * Growth percentiles are based on CDC (Boys, 2-20 Years) data.     Health Maintenance Due  Topic Date Due  . Hepatitis C Screening  Never done  . HPV VACCINES (1 - Male 2-dose series) Never done  . HIV Screening  Never done       Topic Date Due  . HPV VACCINES (1 - Male 2-dose series) Never done    Lab Results  Component Value Date   TSH <0.01 (L) 10/10/2020   Lab Results  Component Value Date   WBC 4.7 10/10/2020   HGB 14.8 10/10/2020   HCT 42.7 10/10/2020   MCV 85.4 10/10/2020   PLT 223.0 10/10/2020   Lab Results  Component Value Date   NA 135 10/10/2020   K 3.8 10/10/2020   CO2 28 10/10/2020   GLUCOSE 93 10/10/2020   BUN 18 10/10/2020   CREATININE 0.89 10/10/2020   BILITOT 1.2 10/10/2020   ALKPHOS 103 10/10/2020   AST 17 10/10/2020   ALT 15 10/10/2020   PROT 7.4 10/10/2020   ALBUMIN 4.9 10/10/2020   CALCIUM 9.6 10/10/2020   ANIONGAP 9 01/21/2020   GFR 123.51 10/10/2020   No results found for: CHOL No results found for: HDL No results found for: LDLCALC No results found for:  TRIG No results found for: CHOLHDL No results found for: 12/08/2020    Assessment & Plan:   Problem List Items Addressed This Visit      Endocrine   Hyperthyroidism   Thyromegaly     Other   Healthcare  maintenance - Primary   Relevant Orders   Lipid panel   Urinalysis, Routine w reflex microscopic   Ambulatory referral to Ophthalmology      No orders of the defined types were placed in this encounter.   Follow-up: Return in about 3 months (around 04/16/2021).   Gave patient information on health maintenance and disease prevention.  Agrees to go for an eye check. Mliss Sax, MD

## 2021-01-16 LAB — LIPID PANEL
Cholesterol: 159 mg/dL (ref <200)
HDL: 65 mg/dL (ref >=40)
LDL Cholesterol (Calc): 79 mg/dL (calc)
Non-HDL Cholesterol (Calc): 94 mg/dL (calc) (ref <130)
Total CHOL/HDL Ratio: 2.4 (calc) (ref <5.0)
Triglycerides: 74 mg/dL (ref <150)

## 2021-01-16 LAB — URINALYSIS, ROUTINE W REFLEX MICROSCOPIC
Bilirubin Urine: NEGATIVE
Glucose, UA: NEGATIVE
Hgb urine dipstick: NEGATIVE
Ketones, ur: NEGATIVE
Leukocytes,Ua: NEGATIVE
Nitrite: NEGATIVE
Protein, ur: NEGATIVE
Specific Gravity, Urine: 1.026 (ref 1.001–1.03)
pH: 7.5 (ref 5.0–8.0)

## 2021-02-10 ENCOUNTER — Telehealth: Payer: Self-pay | Admitting: Family Medicine

## 2021-02-10 NOTE — Telephone Encounter (Signed)
Tried to call about ophthamology referral put in, They have tried to call sveral times and havent been able to get in touch with pt, lvm for pt to call me back, I need to know if he still needs referral or not

## 2021-02-13 ENCOUNTER — Ambulatory Visit: Payer: Managed Care, Other (non HMO) | Admitting: Internal Medicine

## 2021-02-13 NOTE — Progress Notes (Deleted)
Name: Rodney [name]  MRN/ DOB: [id number], Sep 03, 2001    Age/ Sex: 20 y.o., male     PCP: Mliss Sax, MD   Reason for Endocrinology Evaluation: Hyperthyroidism     Initial Endocrinology Clinic Visit: 02/15/2020    PATIENT IDENTIFIER: Rodney [name] is a 20 y.o., male with a past medical history of Asthma and Hyperthyroidism He has followed with [facility] since 02/15/2020 for consultative assistance with management of his Hyperthyroidism.   HISTORICAL SUMMARY: Pt was diagnosed with hyperthyroidism in 12/2019 with a suppressed TSH of < 0.01 uIU/mL and elevated FT4 at > 5.50 ng/dL and elevated FT3 at 81.4 pg/mL during evaluation of asthma attack. He was noted to have thyromegaly at the time.    On his initial presentation to our clinic he was on Metoprolol , but we switched to Cardizem and started methimazole   Maternal Grandmother and aunt with thyroid disease  SUBJECTIVE:    Today (02/13/2021):  Rodney [name] is here for a follow up on hyperthyroidism. On his last visit here with reduced his Methimazole from 1 tablet to half a tablet and developed hyperthyroidism again, so we increased it back again to 1 tablet daily   Pt has been noted stable weight  Denies vomiting or abdominal pain nor diarrhea  Denies palpitations  Denies eye symptoms Denies local neck symptoms     Medications Methimazole 5 mg daily      HISTORY:  Past Medical History:  Past Medical History:  Diagnosis Date  . 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 Asthma   . Tachycardia   . Thyroid disease     Past Surgical History: No past surgical history on file.  Social History:  reports that he has never smoked. He has never used smokeless tobacco. He reports that he does not drink alcohol and does not use drugs. Family History:  Family History  Problem Relation Age of Onset  . Healthy Mother   . Healthy Father   . Healthy Maternal Grandmother    . Thyroid disease Maternal Grandmother   . Healthy Maternal Grandfather   . Healthy Paternal Grandmother   . Healthy Paternal Grandfather      HOME MEDICATIONS: Allergies as of 02/13/2021   No Known Allergies     Medication List       Accurate as of Feb 13, 2021 10:51 AM. If you have any questions, ask your nurse or doctor.        albuterol 108 (90 Base) MCG/ACT inhaler Commonly known as: VENTOLIN HFA Inhale 2 puffs into the lungs every 6 (six) hours as needed for wheezing or shortness of breath.   methimazole 5 MG tablet Commonly known as: TAPAZOLE Take 3 tablets (15 mg total) by mouth daily.         OBJECTIVE:   PHYSICAL EXAM: VS: There were no vitals taken for this visit.   EXAM: General: Pt appears well and is in NAD  Neck: General: Supple without adenopathy. Thyroid: Thyroid size is 60 grams.  No goiter or nodules appreciated.No bruits  Lungs: Clear with good BS bilat with no rales, rhonchi, or wheezes  Heart: Auscultation: RRR.  Abdomen: Normoactive bowel sounds, soft, nontender, without masses or organomegaly palpable  Extremities:  BL LE: No pretibial edema normal ROM and strength.  Mental Status: Judgment, insight: Intact Mood and affect: No depression, anxiety, or agitation     DATA REVIEWED:  Results for CESAR, ROGERSON (MRN [id number]) as of 10/13/2020  08:00  Ref. Range 10/10/2020 15:38  Sodium Latest Ref Range: 135 - 145 mEq/L 135  Potassium Latest Ref Range: 3.5 - 5.1 mEq/L 3.8  Chloride Latest Ref Range: 96 - 112 mEq/L 103  CO2 Latest Ref Range: 19 - 32 mEq/L 28  Glucose Latest Ref Range: 70 - 99 mg/dL 93  BUN Latest Ref Range: 6 - 23 mg/dL 18  Creatinine Latest Ref Range: 0.40 - 1.50 mg/dL 8.97  Calcium Latest Ref Range: 8.4 - 10.5 mg/dL 9.6  Alkaline Phosphatase Latest Ref Range: 39 - 117 U/L 103  Albumin Latest Ref Range: 3.5 - 5.2 g/dL 4.9  AST Latest Ref Range: 0 - 37 U/L 17  ALT Latest Ref Range: 0 - 53 U/L 15  Total  Protein Latest Ref Range: 6.0 - 8.3 g/dL 7.4  Total Bilirubin Latest Ref Range: 0.2 - 1.2 mg/dL 1.2  GFR Latest Ref Range: >60.00 mL/min 123.51  WBC Latest Ref Range: 4.5 - 10.5 K/uL 4.7  RBC Latest Ref Range: 4.22 - 5.81 Mil/uL 5.00  Hemoglobin Latest Ref Range: 13.0 - 17.0 g/dL 84.7  HCT Latest Ref Range: 39.0 - 52.0 % 42.7  MCV Latest Ref Range: 78.0 - 100.0 fl 85.4  MCHC Latest Ref Range: 30.0 - 36.0 g/dL 84.1  RDW Latest Ref Range: 11.5 - 14.6 % 12.1  Platelets Latest Ref Range: 150.0 - 400.0 K/uL 223.0  Neutrophils Latest Ref Range: 43.0 - 77.0 % 41.4 (L)  Lymphocytes Latest Ref Range: 12.0 - 46.0 % 46.6 (H)  Monocytes Relative Latest Ref Range: 3.0 - 12.0 % 7.5  Eosinophil Latest Ref Range: 0.0 - 5.0 % 3.6  Basophil Latest Ref Range: 0.0 - 3.0 % 0.9  NEUT# Latest Ref Range: 1.4 - 7.7 K/uL 2.0  Lymphocyte # Latest Ref Range: 0.7 - 4.0 K/uL 2.2  Monocyte # Latest Ref Range: 0.1 - 1.0 K/uL 0.4  Eosinophils Absolute Latest Ref Range: 0.0 - 0.7 K/uL 0.2  Basophils Absolute Latest Ref Range: 0.0 - 0.1 K/uL 0.0  TSH Latest Ref Range: 0.35 - 5.50 uIU/mL <0.01 (L)  T4,Free(Direct) Latest Ref Range: 0.60 - 1.60 ng/dL 2.82 (H)      TSI <081 % baseline 314High       ASSESSMENT / PLAN / RECOMMENDATIONS:   1. Hyperthyroidism Secondary to Graves' Disease:  - Pt is clinically  Euthyroid but unfortunately biochemically he continues to be hyperthyroid - There's questionable consistent intake of methimazole   -Will increase as below  - I have advised him to make sure to verify the milligram of his tabs    Medications   Increase Methimazole 5 mg, 3 tablet daily     2. Graves' Disease:    - No extrathyroidal manifestations of thyroid disease.      F/U in 4 months     Signed electronically by: Lyndle Herrlich, MD  [facility]  [facility] [address] [place], [place] [address] Phone: [phone] FAX: [phone]       CC: Mliss Sax, MD [address] [place] [address] Phone: [phone]  Fax: [phone]   Return to Endocrinology clinic as below: Future Appointments  Date Time Provider Department Center  02/13/2021  2:40 PM Lydie Stammen, Konrad Dolores, MD [id number] None  04/16/2021  3:00 PM Mliss Sax, MD [id number]

## 2021-02-20 ENCOUNTER — Other Ambulatory Visit: Payer: Self-pay | Admitting: Internal Medicine

## 2021-04-08 ENCOUNTER — Ambulatory Visit (INDEPENDENT_AMBULATORY_CARE_PROVIDER_SITE_OTHER): Payer: Managed Care, Other (non HMO) | Admitting: Internal Medicine

## 2021-04-08 ENCOUNTER — Other Ambulatory Visit: Payer: Self-pay

## 2021-04-08 ENCOUNTER — Encounter: Payer: Self-pay | Admitting: Internal Medicine

## 2021-04-08 VITALS — BP 120/88 | HR 96 | Ht 69.0 in | Wt 152.0 lb

## 2021-04-08 DIAGNOSIS — E0500 Thyrotoxicosis with diffuse goiter without thyrotoxic crisis or storm: Secondary | ICD-10-CM

## 2021-04-08 DIAGNOSIS — E0590 Thyrotoxicosis, unspecified without thyrotoxic crisis or storm: Secondary | ICD-10-CM | POA: Diagnosis not present

## 2021-04-08 LAB — COMPREHENSIVE METABOLIC PANEL
ALT: 18 U/L (ref 0–53)
AST: 21 U/L (ref 0–37)
Albumin: 5.1 g/dL (ref 3.5–5.2)
Alkaline Phosphatase: 84 U/L (ref 39–117)
BUN: 17 mg/dL (ref 6–23)
CO2: 27 mEq/L (ref 19–32)
Calcium: 10.2 mg/dL (ref 8.4–10.5)
Chloride: 102 mEq/L (ref 96–112)
Creatinine, Ser: 0.84 mg/dL (ref 0.40–1.50)
GFR: 125.25 mL/min (ref >60.00)
Glucose, Bld: 92 mg/dL (ref 70–99)
Potassium: 4.2 mEq/L (ref 3.5–5.1)
Sodium: 138 mEq/L (ref 135–145)
Total Bilirubin: 1.2 mg/dL (ref 0.2–1.2)
Total Protein: 8 g/dL (ref 6.0–8.3)

## 2021-04-08 LAB — CBC WITH DIFFERENTIAL/PLATELET
Basophils Absolute: 0 10*3/uL (ref 0.0–0.1)
Basophils Relative: 0.6 % (ref 0.0–3.0)
Eosinophils Absolute: 0.1 10*3/uL (ref 0.0–0.7)
Eosinophils Relative: 1.6 % (ref 0.0–5.0)
HCT: 44.8 % (ref 39.0–52.0)
Hemoglobin: 15.4 g/dL (ref 13.0–17.0)
Lymphocytes Relative: 34.9 % (ref 12.0–46.0)
Lymphs Abs: 1.5 10*3/uL (ref 0.7–4.0)
MCHC: 34.5 g/dL (ref 30.0–36.0)
MCV: 86.4 fl (ref 78.0–100.0)
Monocytes Absolute: 0.4 10*3/uL (ref 0.1–1.0)
Monocytes Relative: 9 % (ref 3.0–12.0)
Neutro Abs: 2.3 10*3/uL (ref 1.4–7.7)
Neutrophils Relative %: 53.9 % (ref 43.0–77.0)
Platelets: 202 10*3/uL (ref 150.0–400.0)
RBC: 5.19 Mil/uL (ref 4.22–5.81)
RDW: 12.8 % (ref 11.5–14.6)
WBC: 4.3 10*3/uL — ABNORMAL LOW (ref 4.5–10.5)

## 2021-04-08 LAB — T4, FREE: Free T4: 2.94 ng/dL — ABNORMAL HIGH (ref 0.60–1.60)

## 2021-04-08 LAB — TSH: TSH: 0 u[IU]/mL — ABNORMAL LOW (ref 0.35–5.50)

## 2021-04-08 MED ORDER — DILTIAZEM HCL ER COATED BEADS 120 MG PO CP24: 120.0000 mg | ORAL_CAPSULE | Freq: Every day | ORAL | 6 refills | Status: DC

## 2021-04-08 NOTE — Patient Instructions (Signed)
-   Continue Methimazole 5 mg , THREE tablets a day for now    - Start Cardizem 120 mg, ONE tablet daily

## 2021-04-08 NOTE — Progress Notes (Signed)
Name: Rodney [name]  MRN/ DOB: [id number], 04/26/2001    Age/ Sex: 20 y.o., male     PCP: Mliss Sax, MD   Reason for Endocrinology Evaluation: Hyperthyroidism     Initial Endocrinology Clinic Visit: 02/15/2020    PATIENT IDENTIFIER: [name] is a 20 y.o., male with a past medical history of Asthma and Hyperthyroidism He has followed with [facility] since 02/15/2020 for consultative assistance with management of his Hyperthyroidism.   HISTORICAL SUMMARY: Pt was diagnosed with hyperthyroidism in 12/2019 with a suppressed TSH of < 0.01 uIU/mL and elevated FT4 at > 5.50 ng/dL and elevated FT3 at 65.6 pg/mL during evaluation of asthma attack. He was noted to have thyromegaly at the time.    On his initial presentation to our clinic he was on Metoprolol , but we switched to Cardizem and started methimazole   Maternal Grandmother and aunt with thyroid disease  SUBJECTIVE:    Today (04/08/2021):  Rodney [name] is here for a follow up on hyperthyroidism.     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 Social History:  reports that he has never smoked. He has never used smokeless tobacco. He reports that he does not drink alcohol and does not use drugs. Family History:  Family History  Problem Relation Age of Onset   Healthy Mother    Healthy Father    Healthy Maternal Grandmother    Thyroid disease Maternal Grandmother    Healthy Maternal Grandfather    Healthy Paternal Grandmother    Healthy Paternal Grandfather       HOME MEDICATIONS: Allergies as of 04/08/2021   No Known Allergies      Medication List        Accurate as of April 08, 2021  2:54 PM. If you have any questions, ask your nurse or doctor.          albuterol 108 (90 Base) MCG/ACT inhaler Commonly known as: VENTOLIN HFA Inhale 2 puffs into the lungs every 6 (six) hours as needed for wheezing or shortness of breath.   methimazole 5 MG tablet Commonly known as: TAPAZOLE Take 3 tablets (15 mg total) by mouth daily.          OBJECTIVE:   PHYSICAL EXAM: VS: BP 120/88   Pulse 96   Ht 5\' 9"  (1.753 m)   Wt 152 lb (68.9 kg)   SpO2 97%   BMI 22.45 kg/m     EXAM: General: Pt appears well and is in NAD  Neck: General: Supple without adenopathy. Thyroid: Thyroid size is 60 grams.  No goiter or nodules appreciated.No bruits  Lungs: Clear with good BS bilat with no rales, rhonchi, or wheezes  Heart: Auscultation: RRR.  Abdomen: Normoactive bowel sounds, soft, nontender, without masses or organomegaly palpable  Extremities:  BL LE: No pretibial edema normal ROM and strength.  Mental Status: Judgment, insight: Intact Mood and affect: No depression, anxiety, or agitation     DATA REVIEWED:   Results for KRAJAN-BLACKWELL, Rodney  I (MRN [id number]) as of 04/09/2021 13:29  Ref. Range 04/08/2021 15:30  Sodium Latest Ref Range: 135 - 145 mEq/L 138  Potassium Latest Ref Range: 3.5 - 5.1 mEq/L 4.2  Chloride Latest Ref Range: 96 - 112 mEq/L 102  CO2 Latest Ref Range: 19 - 32 mEq/L 27  Glucose Latest Ref Range: 70 - 99 mg/dL 92  BUN Latest Ref Range: 6 - 23 mg/dL 17  Creatinine Latest Ref Range: 0.40 - 1.50 mg/dL 7.35  Calcium Latest Ref Range: 8.4 - 10.5 mg/dL 32.9  Alkaline Phosphatase Latest Ref Range: 39 - 117 U/L 84  Albumin Latest Ref Range: 3.5 - 5.2 g/dL 5.1  AST Latest Ref Range: 0 - 37 U/L 21  ALT Latest Ref Range: 0 - 53 U/L 18  Total Protein Latest Ref Range: 6.0 - 8.3 g/dL 8.0  Total Bilirubin Latest Ref  Range: 0.2 - 1.2 mg/dL 1.2  GFR Latest Ref Range: >60.00 mL/min 125.25  WBC Latest Ref Range: 4.5 - 10.5 K/uL 4.3 (L)  RBC Latest Ref Range: 4.22 - 5.81 Mil/uL 5.19  Hemoglobin Latest Ref Range: 13.0 - 17.0 g/dL 92.4  HCT Latest Ref Range: 39.0 - 52.0 % 44.8  MCV Latest Ref Range: 78.0 - 100.0 fl 86.4  MCHC Latest Ref Range: 30.0 - 36.0 g/dL 26.8  RDW Latest Ref Range: 11.5 - 14.6 % 12.8  Platelets Latest Ref Range: 150.0 - 400.0 K/uL 202.0  Neutrophils Latest Ref Range: 43.0 - 77.0 % 53.9  Lymphocytes Latest Ref Range: 12.0 - 46.0 % 34.9  Monocytes Relative Latest Ref Range: 3.0 - 12.0 % 9.0  Eosinophil Latest Ref Range: 0.0 - 5.0 % 1.6  Basophil Latest Ref Range: 0.0 - 3.0 % 0.6  NEUT# Latest Ref Range: 1.4 - 7.7 K/uL 2.3  Lymphocyte # Latest Ref Range: 0.7 - 4.0 K/uL 1.5  Monocyte # Latest Ref Range: 0.1 - 1.0 K/uL 0.4  Eosinophils Absolute Latest Ref Range: 0.0 - 0.7 K/uL 0.1  Basophils Absolute Latest Ref Range: 0.0 - 0.1 K/uL 0.0  TSH Latest Ref Range: 0.35 - 5.50 uIU/mL 0.00 Repeated and verified X2. (L)  T4,Free(Direct) Latest Ref Range: 0.60 - 1.60 ng/dL 3.41 (H)      TSI <962 % baseline 314High       ASSESSMENT / PLAN / RECOMMENDATIONS:   Hyperthyroidism Secondary to Graves' Disease:  -Patient is clinically and biochemically hyperthyroid -It is interesting to me that when he was on 10 mg of methimazole his THS increased to 9.33 you IU/mL, and now on 15 mg his TSH continues to be suppressed.  I questioned compliance issues, but he assures me that he has been compliant with methimazole intake - We discussed that Graves' Disease is a result of an autoimmune condition involving the thyroid.   We extensively discussed the various treatment options for hyperthyroidism and Graves disease including ablation therapy with radioactive iodine versus antithyroid drug treatment versus surgical therapy.  We discussed the various possible benefits versus side effects of the various  therapies.   I carefully explained to the patient that one of the consequences of I-131 ablation treatment would likely be permanent hypothyroidism which would require long-term replacement therapy with LT4.  -He was given a pamphlet about radioactive iodine ablation, he will let me know if he is interested in surgical intervention versus radioactive iodine ablation.  Medications   Increase Methimazole 5 mg, 3 tablet every morning and 2 tablets every afternoon Restart diltiazem 120 mg daily   2. Graves'  Disease:    - No extrathyroidal manifestations of thyroid disease.      F/U in 4 months     Signed electronically by: Lyndle Herrlich, MD  [facility]  [facility] [address] [place] [place], [place] [address] Phone: [phone] FAX: [phone]      CC: Mliss Sax, MD [address] [place] [address] Phone: [phone]  Fax: [phone]   Return to Endocrinology clinic as below: Future Appointments  Date Time Provider Department Center  04/08/2021  3:00 PM Rodney [name], Konrad Dolores, MD [facility] None  04/16/2021  3:00 PM Mliss Sax, MD [facility]

## 2021-04-09 MED ORDER — METHIMAZOLE 5 MG PO TABS: ORAL_TABLET | ORAL | 1 refills | Status: DC

## 2021-04-16 ENCOUNTER — Ambulatory Visit: Payer: Managed Care, Other (non HMO) | Admitting: Family Medicine

## 2021-04-19 ENCOUNTER — Encounter: Payer: Self-pay | Admitting: Internal Medicine

## 2021-04-19 DIAGNOSIS — E0590 Thyrotoxicosis, unspecified without thyrotoxic crisis or storm: Secondary | ICD-10-CM

## 2021-04-19 DIAGNOSIS — E0500 Thyrotoxicosis with diffuse goiter without thyrotoxic crisis or storm: Secondary | ICD-10-CM

## 2021-04-22 ENCOUNTER — Other Ambulatory Visit: Payer: Self-pay

## 2021-04-23 ENCOUNTER — Encounter: Payer: Self-pay | Admitting: Family Medicine

## 2021-04-23 ENCOUNTER — Ambulatory Visit (INDEPENDENT_AMBULATORY_CARE_PROVIDER_SITE_OTHER): Payer: Managed Care, Other (non HMO) | Admitting: Family Medicine

## 2021-04-23 VITALS — BP 122/66 | HR 87 | Temp 97.7°F | Ht 69.0 in | Wt 158.0 lb

## 2021-04-23 DIAGNOSIS — J4520 Mild intermittent asthma, uncomplicated: Secondary | ICD-10-CM | POA: Diagnosis not present

## 2021-04-23 DIAGNOSIS — E0590 Thyrotoxicosis, unspecified without thyrotoxic crisis or storm: Secondary | ICD-10-CM | POA: Diagnosis not present

## 2021-04-23 DIAGNOSIS — J45909 Unspecified asthma, uncomplicated: Secondary | ICD-10-CM | POA: Insufficient documentation

## 2021-04-23 DIAGNOSIS — Z7721 Contact with and (suspected) exposure to potentially hazardous body fluids: Secondary | ICD-10-CM | POA: Insufficient documentation

## 2021-04-23 MED ORDER — ALBUTEROL SULFATE HFA 108 (90 BASE) MCG/ACT IN AERS: 2.0000 | INHALATION_SPRAY | Freq: Four times a day (QID) | RESPIRATORY_TRACT | 2 refills | Status: AC | PRN

## 2021-04-23 NOTE — Progress Notes (Signed)
Established Patient Office Visit  Subjective:  Patient ID: Rodney [name], male    DOB: 2001-03-13  Age: 20 y.o. MRN: [id number]  CC:  Chief Complaint  Patient presents with   Follow-up    Follow up on thyroid no concerns.     HPI TC KAPUSTA presents for follow-up of Graves' disease, reactive airway disease and body fluid exposure.  Patient is sexually active with 1 partner.  He would like to be checked for STDs.  He uses an albuterol inhaler when the seasons change and sometimes with spring allergies.  He rarely requires it.  Has decided to go for radioactive iodine therapy to treat his Graves' disease.  We discussed that.  Past Medical History:  Diagnosis Date   Allergy to animal dander    Asthma    Tachycardia    Thyroid disease     No past surgical history on file.  Family History  Problem Relation Age of Onset   Healthy Mother    Healthy Father    Healthy Maternal Grandmother    Thyroid disease Maternal Grandmother    Healthy Maternal Grandfather    Healthy Paternal Grandmother    Healthy Paternal Grandfather     Social History   Socioeconomic History   Marital status: Single    Spouse name: Not on file   Number of children: Not on file   Years of education: Not on file   Highest education level: Not on file  Occupational History   Not on file  Tobacco Use   Smoking status: Never   Smokeless tobacco: Never  Vaping Use   Vaping Use: Never used  Substance and Sexual Activity   Alcohol use: No   Drug use: No   Sexual activity: Yes  Other Topics Concern   Not on file  Social History Narrative   Not on file   Social Determinants of Health   Financial Resource Strain: Not on file  Food Insecurity: Not on file  Transportation Needs: Not on file  Physical Activity: Not on file  Stress: Not on file  Social Connections: Not on file  Intimate Partner Violence: Not on file    Outpatient Medications Prior to Visit   Medication Sig Dispense Refill   diltiazem (DILTIAZEM CD) 120 MG 24 hr capsule Take 1 capsule (120 mg total) by mouth daily. 30 capsule 6   methimazole (TAPAZOLE) 5 MG tablet Take 3 tablets (15 mg total) by mouth daily with breakfast AND 2 tablets (10 mg total) daily with supper. 450 tablet 1   albuterol (VENTOLIN HFA) 108 (90 Base) MCG/ACT inhaler Inhale 2 puffs into the lungs every 6 (six) hours as needed for wheezing or shortness of breath. (Patient not taking: No sig reported) 18 g 11   No facility-administered medications prior to visit.    No Known Allergies  ROS Review of Systems  Constitutional: Negative.   Respiratory: Negative.    Cardiovascular: Negative.  Negative for palpitations.  Gastrointestinal: Negative.   Genitourinary:  Negative for dysuria, genital sores and penile discharge.     Objective:    Physical Exam Vitals and nursing note reviewed.  Constitutional:      Appearance: Normal appearance. He is normal weight.  HENT:     Head: Normocephalic and atraumatic.  Eyes:     Extraocular Movements: Extraocular movements intact.     Conjunctiva/sclera: Conjunctivae normal.     Pupils: Pupils are equal, round, and reactive to light.  Neck:  Thyroid: Thyromegaly present. No thyroid tenderness.  Cardiovascular:     Rate and Rhythm: Normal rate and regular rhythm.  Pulmonary:     Effort: Pulmonary effort is normal.     Breath sounds: Normal breath sounds.  Neurological:     Mental Status: He is alert.    BP 122/66   Pulse 87   Temp 97.7 F (36.5 C) (Temporal)   Ht 5\' 9"  (1.753 m)   Wt 158 lb (71.7 kg)   SpO2 98%   BMI 23.33 kg/m  Wt Readings from Last 3 Encounters:  04/23/21 158 lb (71.7 kg)  04/08/21 152 lb (68.9 kg)  01/15/21 155 lb 9.6 oz (70.6 kg)     Health Maintenance Due  Topic Date Due   COVID-19 Vaccine (1) Never done   HPV VACCINES (1 - Male 2-dose series) Never done   HIV Screening  Never done   Hepatitis C Screening  Never done        Topic Date Due   HPV VACCINES (1 - Male 2-dose series) Never done    Lab Results  Component Value Date   TSH 0.00 Repeated and verified X2. (L) 04/08/2021   Lab Results  Component Value Date   WBC 4.3 (L) 04/08/2021   HGB 15.4 04/08/2021   HCT 44.8 04/08/2021   MCV 86.4 04/08/2021   PLT 202.0 04/08/2021   Lab Results  Component Value Date   NA 138 04/08/2021   K 4.2 04/08/2021   CO2 27 04/08/2021   GLUCOSE 92 04/08/2021   BUN 17 04/08/2021   CREATININE 0.84 04/08/2021   BILITOT 1.2 04/08/2021   ALKPHOS 84 04/08/2021   AST 21 04/08/2021   ALT 18 04/08/2021   PROT 8.0 04/08/2021   ALBUMIN 5.1 04/08/2021   CALCIUM 10.2 04/08/2021   ANIONGAP 9 01/21/2020   GFR 125.25 04/08/2021   Lab Results  Component Value Date   CHOL 159 01/15/2021   Lab Results  Component Value Date   HDL 65 01/15/2021   Lab Results  Component Value Date   LDLCALC 79 01/15/2021   Lab Results  Component Value Date   TRIG 74 01/15/2021   Lab Results  Component Value Date   CHOLHDL 2.4 01/15/2021   No results found for: HGBA1C    Assessment & Plan:   Problem List Items Addressed This Visit       Respiratory   Reactive airway disease   Relevant Medications   albuterol (VENTOLIN HFA) 108 (90 Base) MCG/ACT inhaler     Endocrine   Hyperthyroidism - Primary     Other   History of potentially hazardous body fluid exposure   Relevant Orders   HIV Antibody (routine testing w rflx)   RPR   Urine cytology ancillary only    Meds ordered this encounter  Medications   albuterol (VENTOLIN HFA) 108 (90 Base) MCG/ACT inhaler    Sig: Inhale 2 puffs into the lungs every 6 (six) hours as needed for wheezing or shortness of breath.    Dispense:  18 g    Refill:  2    Follow-up: Return in about 1 year (around 04/23/2022), or if symptoms worsen or fail to improve.  Given information on safe sex and radioactive iodine therapy for Graves' disease.  04/25/2022, MD

## 2021-04-24 LAB — HIV ANTIBODY (ROUTINE TESTING W REFLEX): HIV 1&2 Ab, 4th Generation: NONREACTIVE

## 2021-04-24 LAB — RPR: RPR Ser Ql: NONREACTIVE

## 2021-05-04 ENCOUNTER — Other Ambulatory Visit (HOSPITAL_COMMUNITY): Payer: BC Managed Care – PPO

## 2021-05-04 ENCOUNTER — Encounter (HOSPITAL_COMMUNITY): Payer: BC Managed Care – PPO

## 2021-05-05 ENCOUNTER — Other Ambulatory Visit (HOSPITAL_COMMUNITY): Payer: BC Managed Care – PPO

## 2021-05-07 ENCOUNTER — Other Ambulatory Visit: Payer: Self-pay

## 2021-05-07 ENCOUNTER — Ambulatory Visit (HOSPITAL_COMMUNITY)
Admission: RE | Admit: 2021-05-07 | Discharge: 2021-05-07 | Disposition: A | Discharge status: Home / Self Care | Payer: Managed Care, Other (non HMO) | Source: Ambulatory Visit | Attending: Internal Medicine | Admitting: Internal Medicine

## 2021-05-07 DIAGNOSIS — E0500 Thyrotoxicosis with diffuse goiter without thyrotoxic crisis or storm: Secondary | ICD-10-CM | POA: Insufficient documentation

## 2021-05-08 ENCOUNTER — Ambulatory Visit (HOSPITAL_COMMUNITY)
Admission: RE | Admit: 2021-05-08 | Discharge: 2021-05-08 | Disposition: A | Discharge status: Home / Self Care | Payer: BC Managed Care – PPO | Source: Ambulatory Visit | Attending: Internal Medicine | Admitting: Internal Medicine

## 2021-05-08 MED ORDER — SODIUM IODIDE I-123 7.4 MBQ CAPS
450.0000 | ORAL_CAPSULE | Freq: Once | ORAL | Status: AC
  Administered 2021-05-07: 450 via ORAL

## 2021-05-09 ENCOUNTER — Other Ambulatory Visit (HOSPITAL_COMMUNITY): Payer: BC Managed Care – PPO

## 2021-05-10 ENCOUNTER — Other Ambulatory Visit (HOSPITAL_COMMUNITY): Payer: BC Managed Care – PPO

## 2021-05-11 ENCOUNTER — Other Ambulatory Visit: Payer: Self-pay | Admitting: Internal Medicine

## 2021-05-11 DIAGNOSIS — E0500 Thyrotoxicosis with diffuse goiter without thyrotoxic crisis or storm: Secondary | ICD-10-CM

## 2021-05-21 ENCOUNTER — Other Ambulatory Visit: Payer: Managed Care, Other (non HMO)

## 2021-05-27 ENCOUNTER — Encounter: Payer: Self-pay | Admitting: Internal Medicine

## 2021-06-03 NOTE — Written Directive (Addendum)
MOLECULAR IMAGING AND THERAPEUTICS WRITTEN DIRECTIVE   PATIENT NAME: Rodney [name]  PT DOB:   2001/03/21                                              MRN: [id number]  ---------------------------------------------------------------------------------------------------------------------   I-131 WHOLE THYROID THERAPY (NON-CANCER)    RADIOPHARMACEUTICAL:   Iodine-131 Capsule    PRESCRIBED DOSE FOR ADMINISTRATION:    ROUTE OFADMINISTRATION: PO   DIAGNOSIS: Hyperthyroidism   REFERRING PHYSICIAN: Dr. Lonzo Cloud   TSH:    Lab Results  Component Value Date   TSH 0.00 Repeated and verified X2. (L) 04/08/2021   TSH <0.01 (L) 10/10/2020   TSH <0.01 Repeated and verified X2. (L) 07/25/2020     PRIOR I-131 THERAPY (Date and Dose): 13 mCi     PRIOR RADIOLOGY EXAMS (Results and Date): NM THYROID MULT UPTAKE W/IMAGING  Result Date: 05/09/2021 CLINICAL DATA:  Hyperthyroidism EXAM: THYROID SCAN AND UPTAKE - 4 AND 24 HOURS TECHNIQUE: Following oral administration of I-123 capsule, anterior planar imaging was acquired at 24 hours. Thyroid uptake was calculated with a thyroid probe at 4-6 hours and 24 hours. RADIOPHARMACEUTICALS:  450 uCi I-123 sodium iodide p.o. COMPARISON:  None FINDINGS: Diffusely increased tracer uptake within enlarged thyroid lobes bilaterally. No focal areas of increased or decreased tracer uptake seen. 4 hour I-123 uptake = 77.3% (normal 5-20%) 24 hour I-123 uptake = 73.2% (normal 10-30%) IMPRESSION: Markedly elevated 4 hour and 24 hour radio iodine uptakes consistent with hyperthyroidism, the overall appearance of which by imaging being consistent with Graves disease. Electronically Signed   By: Ulyses Southward M.D.   On: 05/09/2021 11:10      ADDITIONAL PHYSICIAN COMMENTS/NOTES: Hx graves disease,nervous.anxious,weight loss,increased appetite,heat intol,diff sleeping,irritability,increased thirst,sore throat, increased perspiration,heart palps .  Depressed  TSH.   AUTHORIZED USER SIGNATURE & TIME STAMP: Patriciaann Clan, MD   06/03/21    8:47 AM

## 2021-06-11 ENCOUNTER — Ambulatory Visit (HOSPITAL_COMMUNITY)
Admission: RE | Admit: 2021-06-11 | Discharge: 2021-06-11 | Disposition: A | Discharge status: Home / Self Care | Payer: Managed Care, Other (non HMO) | Source: Ambulatory Visit | Attending: Internal Medicine | Admitting: Internal Medicine

## 2021-06-11 ENCOUNTER — Other Ambulatory Visit: Payer: Self-pay

## 2021-06-11 DIAGNOSIS — E0500 Thyrotoxicosis with diffuse goiter without thyrotoxic crisis or storm: Secondary | ICD-10-CM | POA: Diagnosis not present

## 2021-06-11 MED ORDER — SODIUM IODIDE I 131 CAPSULE
12.6000 | Freq: Once | INTRAVENOUS | Status: AC | PRN
  Administered 2021-06-11: 12.6 via ORAL

## 2021-06-11 NOTE — Written Directive (Addendum)
MOLECULAR IMAGING AND THERAPEUTICS WRITTEN DIRECTIVE   PATIENT NAME: Rodney [name]  PT DOB:   2001/01/14                                              MRN: [id number]  ---------------------------------------------------------------------------------------------------------------------   I-131 WHOLE THYROID THERAPY (NON-CANCER)    RADIOPHARMACEUTICAL:   Iodine-131 Capsule    PRESCRIBED DOSE FOR ADMINISTRATION: 13.0 mCi I-131   ROUTE OFADMINISTRATION: PO   DIAGNOSIS:  Graves' Disease   REFERRING PHYSICIAN: Shamleffer   TSH:    Lab Results  Component Value Date   TSH 0.00 Repeated and verified X2. (L) 04/08/2021   TSH <0.01 (L) 10/10/2020   TSH <0.01 Repeated and verified X2. (L) 07/25/2020     PRIOR I-131 THERAPY (Date and Dose): none   PRIOR RADIOLOGY EXAMS (Results and Date): NM THYROID MULT UPTAKE W/IMAGING  Result Date: 05/09/2021 CLINICAL DATA:  Hyperthyroidism EXAM: THYROID SCAN AND UPTAKE - 4 AND 24 HOURS TECHNIQUE: Following oral administration of I-123 capsule, anterior planar imaging was acquired at 24 hours. Thyroid uptake was calculated with a thyroid probe at 4-6 hours and 24 hours. RADIOPHARMACEUTICALS:  450 uCi I-123 sodium iodide p.o. COMPARISON:  None FINDINGS: Diffusely increased tracer uptake within enlarged thyroid lobes bilaterally. No focal areas of increased or decreased tracer uptake seen. 4 hour I-123 uptake = 77.3% (normal 5-20%) 24 hour I-123 uptake = 73.2% (normal 10-30%) IMPRESSION: Markedly elevated 4 hour and 24 hour radio iodine uptakes consistent with hyperthyroidism, the overall appearance of which by imaging being consistent with Graves disease. Electronically Signed   By: Ulyses Southward M.D.   On: 05/09/2021 11:10      ADDITIONAL PHYSICIAN COMMENTS/NOTES   AUTHORIZED USER SIGNATURE & TIME STAMP:  Rosealee Albee, MD   06/11/21    10:37 AM

## 2021-07-06 ENCOUNTER — Telehealth: Payer: Self-pay | Admitting: Internal Medicine

## 2021-07-06 NOTE — Telephone Encounter (Signed)
Judeen Hammans will fax the 3rd page back over for [place] to fill out.

## 2021-07-06 NOTE — Telephone Encounter (Signed)
Can you please on his FMLA papers, it seems I am missing a page, if you can't find it , please contact [employer] to fax the needed part    Thanks

## 2021-07-08 NOTE — Telephone Encounter (Signed)
We have received missing pages and will put on provider desk to fill out

## 2021-07-09 ENCOUNTER — Ambulatory Visit (INDEPENDENT_AMBULATORY_CARE_PROVIDER_SITE_OTHER): Payer: Managed Care, Other (non HMO) | Admitting: Internal Medicine

## 2021-07-09 ENCOUNTER — Other Ambulatory Visit: Payer: Self-pay

## 2021-07-09 VITALS — BP 122/70 | HR 100 | Ht 69.0 in | Wt 151.2 lb

## 2021-07-09 DIAGNOSIS — E0590 Thyrotoxicosis, unspecified without thyrotoxic crisis or storm: Secondary | ICD-10-CM

## 2021-07-09 DIAGNOSIS — E0500 Thyrotoxicosis with diffuse goiter without thyrotoxic crisis or storm: Secondary | ICD-10-CM | POA: Diagnosis not present

## 2021-07-09 LAB — TSH: TSH: <0.01 u[IU]/mL — ABNORMAL LOW (ref 0.35–5.50)

## 2021-07-09 LAB — T4, FREE: Free T4: 5.27 ng/dL — ABNORMAL HIGH (ref 0.60–1.60)

## 2021-07-09 NOTE — Patient Instructions (Signed)
Continue Diltiazem 1 tablet daily

## 2021-07-09 NOTE — Progress Notes (Signed)
Name: Rodney [name]  MRN/ DOB: [id number], 2001-03-22    Age/ Sex: 20 y.o., male     PCP: Mliss Sax, MD   Reason for Endocrinology Evaluation: Hyperthyroidism     Initial Endocrinology Clinic Visit: 02/15/2020    PATIENT IDENTIFIER: Rodney [name] is a 20 y.o., male with a past medical history of Asthma and Hyperthyroidism He has followed with [facility] since 02/15/2020 for consultative assistance with management of his Hyperthyroidism.   HISTORICAL SUMMARY: Pt was diagnosed with hyperthyroidism in 12/2019 with a suppressed TSH of < 0.01 uIU/mL and elevated FT4 at > 5.50 ng/dL and elevated FT3 at 65.4 pg/mL during evaluation of asthma attack. He was noted to have thyromegaly at the time.    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    He is S/P RAI ablation on 06/11/2021 with 12.6 mCi of I-131   Weight has been noted with weight loss  Denies palpitations  Denies vomiting or abdominal  Denies constipation or diarrhea  Denies eye symptoms Denies local neck symptoms  Denies tremors      Diltiazem 120 mg daily - not taking     HISTORY:  Past Medical History:  Past Medical History:  Diagnosis Date   Allergy to animal dander    Asthma    Tachycardia    Thyroid disease    Past Surgical History: No past surgical history on file. Social History:  reports that he has never smoked. He has never used smokeless tobacco. He reports that he does not drink alcohol and does not use drugs. Family History:  Family History  Problem Relation Age of Onset   Healthy Mother    Healthy Father    Healthy Maternal Grandmother    Thyroid disease  Maternal Grandmother    Healthy Maternal Grandfather    Healthy Paternal Grandmother    Healthy Paternal Grandfather      HOME MEDICATIONS: Allergies as of 07/09/2021   No Known Allergies      Medication List        Accurate as of July 09, 2021 10:29 AM. If you have any questions, ask your nurse or doctor.          STOP taking these medications    methimazole 5 MG tablet Commonly known as: TAPAZOLE Stopped by: Scarlette Shorts, MD       TAKE these medications    albuterol 108 (90 Base) MCG/ACT inhaler Commonly known as: VENTOLIN HFA Inhale 2 puffs into the lungs every 6 (six) hours as needed for wheezing or shortness of breath.   diltiazem 120 MG 24 hr capsule Commonly known as: dilTIAZem CD Take 1 capsule (120 mg total) by mouth daily.          OBJECTIVE:   PHYSICAL EXAM: VS: BP 122/70 (BP Location: Left Arm, Patient Position: Sitting, Cuff Size: Small)   Pulse 100   Ht 5\' 9"  (1.753 m)   Wt 151 lb 3.2 oz (68.6 kg)   SpO2 97%   BMI 22.33 kg/m     EXAM: General: Pt appears well and is in NAD  Neck: General: Supple without adenopathy. Thyroid: Thyroid size is 60 grams.  No goiter or nodules appreciated.No bruits  Lungs: Clear with good BS bilat with no rales, rhonchi, or wheezes  Heart: Auscultation: RRR.  Abdomen: Normoactive bowel sounds, soft, nontender, without masses or organomegaly palpable  Extremities:  BL LE: No pretibial edema normal ROM and strength.  Mental Status: Judgment, insight: Intact Mood and affect: No depression, anxiety, or agitation     DATA REVIEWED:  Results for Rodney [name], Rodney [name] (MRN [id number]) as of 07/09/2021 14:16  Ref. Range 07/09/2021 10:47  TSH Latest Ref Range: 0.35 - 5.50 uIU/mL <0.01 (L)  T4,Free(Direct) Latest Ref Range: 0.60 - 1.60 ng/dL 3.09 (H)    TSI <407 % baseline 314High       ASSESSMENT / PLAN / RECOMMENDATIONS:   Hyperthyroidism Secondary to Graves' Disease:   S/P RAI  ablation with 12.6 mCi I-131  on 06/11/2021 Pt encouraged to restart Diltiazem  Pt is clinically hyperthyroid  No local neck symptoms  Biochemically hyperthyroid as well  Medication Restart diltiazem 120 mg daily  2. Graves' Disease:    - No extrathyroidal manifestations of thyroid disease.      F/U in 4 months  Labs in 6 weeks    Signed electronically by: Lyndle Herrlich, MD  [facility]  [facility] [address] [place], [place] [address] Phone: [phone] FAX: [phone]      CC: Mliss Sax, MD [address] [place] [address] Phone: [phone]  Fax: [phone]   Return to Endocrinology clinic as below: Future Appointments  Date Time Provider Department Center  04/29/2022  2:30 PM Mliss Sax, MD [facility]

## 2021-08-20 ENCOUNTER — Other Ambulatory Visit: Payer: Self-pay

## 2021-08-20 ENCOUNTER — Other Ambulatory Visit (INDEPENDENT_AMBULATORY_CARE_PROVIDER_SITE_OTHER): Payer: BC Managed Care – PPO

## 2021-08-20 DIAGNOSIS — E0590 Thyrotoxicosis, unspecified without thyrotoxic crisis or storm: Secondary | ICD-10-CM | POA: Diagnosis not present

## 2021-08-20 LAB — TSH: TSH: 0 u[IU]/mL — ABNORMAL LOW (ref 0.35–5.50)

## 2021-08-20 LAB — T4, FREE: Free T4: 2.27 ng/dL — ABNORMAL HIGH (ref 0.60–1.60)

## 2021-11-12 ENCOUNTER — Encounter: Payer: Self-pay | Admitting: Internal Medicine

## 2021-11-12 ENCOUNTER — Ambulatory Visit (INDEPENDENT_AMBULATORY_CARE_PROVIDER_SITE_OTHER): Payer: Self-pay | Admitting: Internal Medicine

## 2021-11-12 ENCOUNTER — Other Ambulatory Visit: Payer: Self-pay

## 2021-11-12 VITALS — BP 124/82 | HR 63 | Ht 69.0 in | Wt 151.2 lb

## 2021-11-12 DIAGNOSIS — E0590 Thyrotoxicosis, unspecified without thyrotoxic crisis or storm: Secondary | ICD-10-CM

## 2021-11-12 DIAGNOSIS — E0500 Thyrotoxicosis with diffuse goiter without thyrotoxic crisis or storm: Secondary | ICD-10-CM

## 2021-11-12 LAB — TSH: TSH: 0 u[IU]/mL — ABNORMAL LOW (ref 0.35–5.50)

## 2021-11-12 LAB — T4, FREE: Free T4: 4.8 ng/dL — ABNORMAL HIGH (ref 0.60–1.60)

## 2021-11-12 NOTE — Progress Notes (Signed)
Name: Rodney [name]  MRN/ DOB: [id number], [birth year]    Age/ Sex: 21 y.o., male     PCP: Mliss Sax, MD   Reason for Endocrinology Evaluation: Hyperthyroidism     Initial Endocrinology Clinic Visit: 02/15/2020    PATIENT IDENTIFIER: Rodney [name] is a 21 y.o., male with a past medical history of Asthma and Hyperthyroidism He has followed with [facility] since 02/15/2020 for consultative assistance with management of his Hyperthyroidism.   HISTORICAL SUMMARY: Pt was diagnosed with hyperthyroidism in 12/2019 with a suppressed TSH of < 0.01 uIU/mL and elevated FT4 at > 5.50 ng/dL and elevated FT3 at 38.8 pg/mL during evaluation of asthma attack. He was noted to have thyromegaly at the time.    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    He is S/P RAI ablation on 06/11/2021 with 12.6 mCi of I-131   Weight has been stable  Improvement in heat intolerance  Denies palpitations  Denies constipation or diarrhea  Has noted slight decrease in neck size  Denies local neck symptoms     Diltiazem 120 mg daily     HISTORY:  Past Medical History:  Past Medical History:  Diagnosis Date   Allergy to animal dander    Asthma    Tachycardia    Thyroid disease    Past Surgical History: No past surgical history on file. Social History:  reports that he has never smoked. He has never used smokeless tobacco. He reports that he does not drink alcohol and does not use drugs. Family History:  Family History  Problem Relation Age of Onset   Healthy Mother    Healthy Father    Healthy Maternal Grandmother    Thyroid disease Maternal Grandmother     Healthy Maternal Grandfather    Healthy Paternal Grandmother    Healthy Paternal Grandfather      HOME MEDICATIONS: Allergies as of 11/12/2021   No Known Allergies      Medication List        Accurate as of November 12, 2021  7:26 AM. If you have any questions, ask your nurse or doctor.          albuterol 108 (90 Base) MCG/ACT inhaler Commonly known as: VENTOLIN HFA Inhale 2 puffs into the lungs every 6 (six) hours as needed for wheezing or shortness of breath.   diltiazem 120 MG 24 hr capsule Commonly known as: dilTIAZem CD Take 1 capsule (120 mg total) by mouth daily.          OBJECTIVE:   PHYSICAL EXAM: VS:BP 124/82 (BP Location: Left Arm, Patient Position: Sitting, Cuff Size: Small)    Pulse 63    Ht 5\' 9"  (1.753 m)    Wt 151 lb 3.2 oz (68.6 kg)    SpO2 99%    BMI 22.33 kg/m      EXAM: General: Pt appears well and is in NAD  Neck: General: Supple without adenopathy. Thyroid: Thyroid size is 40 grams.  No goiter or nodules appreciated.No bruits  Lungs: Clear with good BS bilat with no rales, rhonchi, or wheezes  Heart: Auscultation: RRR.  Abdomen: Normoactive bowel sounds, soft, nontender, without  masses or organomegaly palpable  Extremities:  BL LE: No pretibial edema normal ROM and strength.  Mental Status: Judgment, insight: Intact Mood and affect: No depression, anxiety, or agitation     DATA REVIEWED:   Latest Reference Range & Units 11/12/21 10:44  TSH 0.35 - 5.50 uIU/mL 0.00 (L)  Triiodothyronine (T3) 76 - 181 ng/dL 817 (H)  R1,HAFB(XUXYBF) 0.60 - 1.60 ng/dL 3.83 (H)     TSI <291 % baseline 314High       ASSESSMENT / PLAN / RECOMMENDATIONS:   Hyperthyroidism Secondary to Graves' Disease:   S/P RAI ablation with 12.6 mCi I-131  on 06/11/2021 Patient continues to be biochemically hyperthyroid but he is clinically euthyroid  no local neck symptoms  I have attempted to call him, left a message to check portal message. The patient will  be given the option to proceed with repeat radioactive iodine ablation versus restarting methimazole  Medication Continue diltiazem 120 mg daily  2. Graves' Disease:    - No extrathyroidal manifestations of thyroid disease.      F/U in 4 months     Signed electronically by: Lyndle Herrlich, MD  [facility] End[facility]inology  [facility] [address] [place] [place], [place] [address] Phone: [phone] FAX: [phone]      CC: Mliss Sax, MD [address] [place] [address] Phone: [phone]  Fax: [phone]   Return to Endocrinology clinic as below: Future Appointments  Date Time Provider Department Center  11/12/2021 10:10 AM Shalaina Guardiola, Konrad Dolores, MD [facility] None  04/29/2022  2:30 PM Mliss Sax, MD [facility]

## 2021-11-13 ENCOUNTER — Encounter: Payer: Self-pay | Admitting: Internal Medicine

## 2021-11-13 DIAGNOSIS — E0500 Thyrotoxicosis with diffuse goiter without thyrotoxic crisis or storm: Secondary | ICD-10-CM

## 2021-11-13 LAB — T3: T3, Total: 417 ng/dL — ABNORMAL HIGH (ref 76–181)

## 2022-01-06 ENCOUNTER — Other Ambulatory Visit: Payer: Self-pay

## 2022-02-09 IMAGING — DX DG CHEST 2V
2 series · 2 of 2 positions shown · non-contrast
Comparison: Chest radiograph dated 07/09/2007.

CLINICAL DATA: 18-year-old male with chest pain.

EXAM:
CHEST - 2 VIEW

[chest pa]
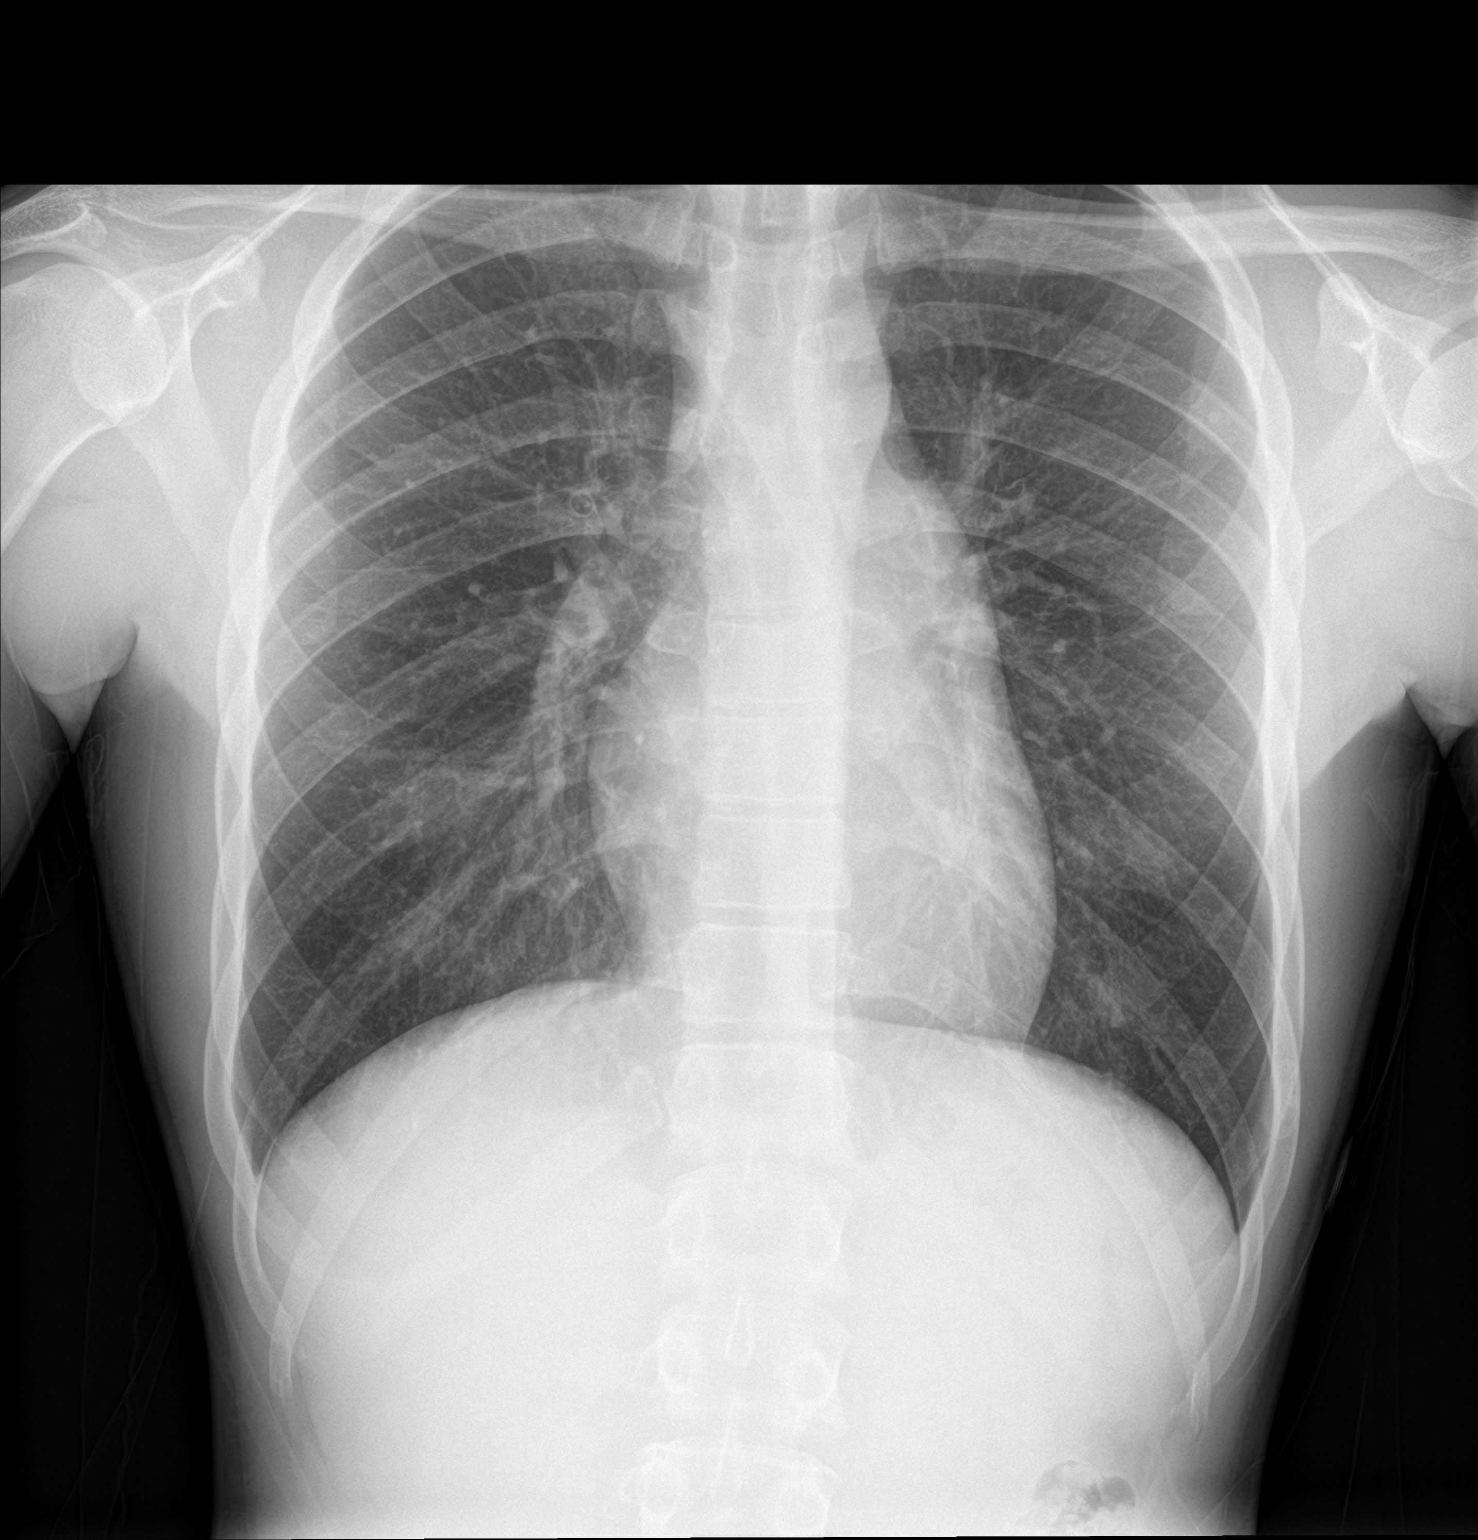

[chest lat]
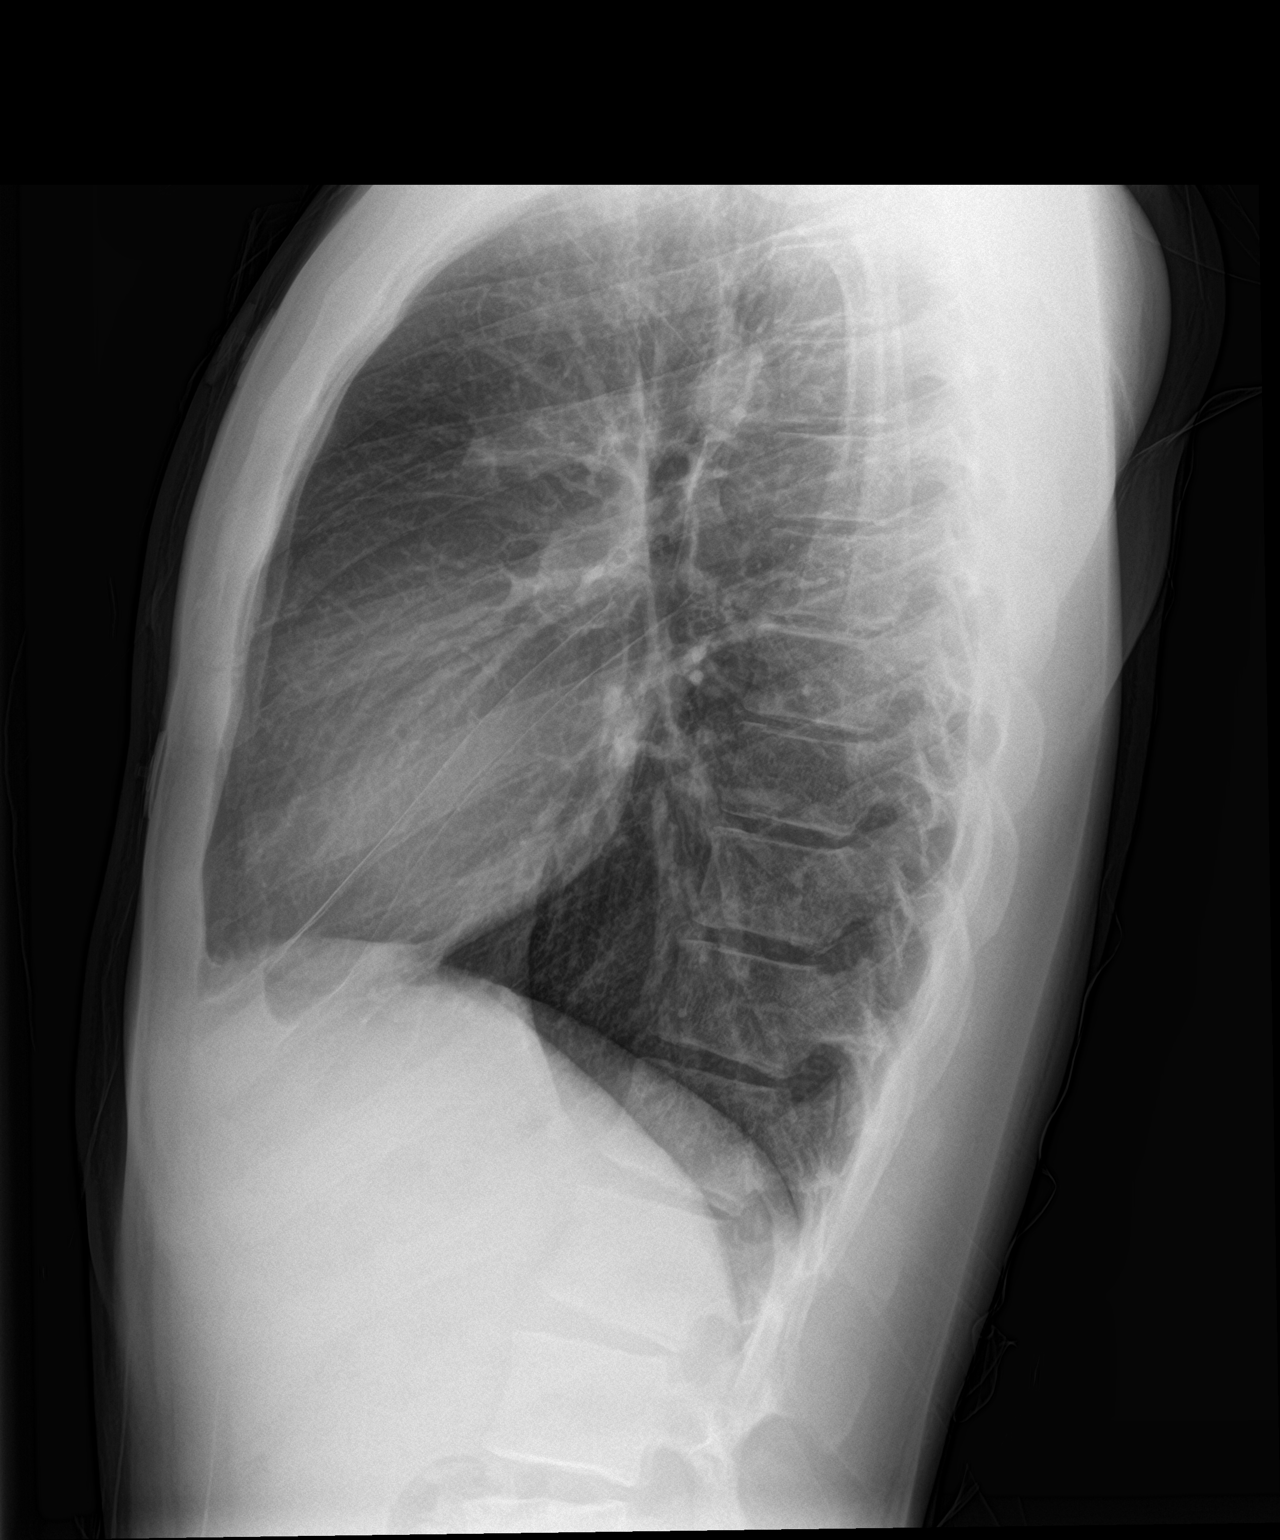

[2 of 2 positions shown; findings below may reference images not displayed]

FINDINGS: The heart size and mediastinal contours are within normal limits.
Both lungs are clear. The visualized skeletal structures are
unremarkable.
IMPRESSION: No active cardiopulmonary disease.

## 2022-03-09 ENCOUNTER — Telehealth: Payer: Self-pay | Admitting: Family Medicine

## 2022-03-24 ENCOUNTER — Ambulatory Visit: Payer: Self-pay | Admitting: Internal Medicine

## 2022-03-24 NOTE — Progress Notes (Deleted)
Name: Rodney [name] [name]  MRN/ DOB: [id number], 12/17/2000    Age/ Sex: 21 y.o., male     PCP: Mliss Sax, MD   Reason for Endocrinology Evaluation: Hyperthyroidism     Initial Endocrinology Clinic Visit: 02/15/2020    PATIENT IDENTIFIER: [name] [name] is a 21 y.o., male with a past medical history of Asthma and Hyperthyroidism He has followed with [facility] since 02/15/2020 for consultative assistance with management of his Hyperthyroidism.   HISTORICAL SUMMARY: Pt was diagnosed with hyperthyroidism in 12/2019 with a suppressed TSH of < 0.01 uIU/mL and elevated FT4 at > 5.50 ng/dL and elevated FT3 at 53.2 pg/mL during evaluation of asthma attack. He was noted to have thyromegaly at the time.    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    He is S/P RAI ablation on 06/11/2021 with 12.6 mCi of I-131   Weight has been stable  Improvement in heat intolerance  Denies palpitations  Denies constipation or diarrhea  Has noted slight decrease in neck size  Denies local neck symptoms     Diltiazem 120 mg daily     HISTORY:  Past Medical History:  Past Medical History:  Diagnosis Date   Allergy to animal dander    Asthma    Tachycardia    Thyroid disease    Past Surgical History: No past surgical history on file. Social History:  reports that he has never smoked. He has never used smokeless tobacco. He reports that he does not drink alcohol and does not use drugs. Family History:  Family History  Problem Relation Age of Onset   Healthy Mother    Healthy Father    Healthy Maternal Grandmother    Thyroid disease Maternal Grandmother     Healthy Maternal Grandfather    Healthy Paternal Grandmother    Healthy Paternal Grandfather      HOME MEDICATIONS: Allergies as of 03/24/2022   No Known Allergies      Medication List        Accurate as of March 24, 2022  1:04 PM. If you have any questions, ask your nurse or doctor.          albuterol 108 (90 Base) MCG/ACT inhaler Commonly known as: VENTOLIN HFA Inhale 2 puffs into the lungs every 6 (six) hours as needed for wheezing or shortness of breath.   diltiazem 120 MG 24 hr capsule Commonly known as: dilTIAZem CD Take 1 capsule (120 mg total) by mouth daily.          OBJECTIVE:   PHYSICAL EXAM: DJ:MEQAS were no vitals taken for this visit.     EXAM: General: Pt appears well and is in NAD  Neck: General: Supple without adenopathy. Thyroid: Thyroid size is 40 grams.  No goiter or nodules appreciated.No bruits  Lungs: Clear with good BS bilat with no rales, rhonchi, or wheezes  Heart: Auscultation: RRR.  Abdomen: Normoactive bowel sounds, soft, nontender, without masses or organomegaly palpable  Extremities:  BL LE: No pretibial edema normal ROM and strength.  Mental Status: Judgment, insight: Intact Mood and affect: No depression, anxiety, or agitation     DATA REVIEWED:   Latest  Reference Range & Units 11/12/21 10:44  TSH 0.35 - 5.50 uIU/mL 0.00 (L)  Triiodothyronine (T3) 76 - 181 ng/dL 630 (H)  Z6,WFUX(NATFTD) 0.60 - 1.60 ng/dL 3.22 (H)     TSI <025 % baseline 314High       ASSESSMENT / PLAN / RECOMMENDATIONS:   Hyperthyroidism Secondary to Graves' Disease:   S/P RAI ablation with 12.6 mCi I-131  on 06/11/2021 Patient continues to be biochemically hyperthyroid but he is clinically euthyroid  no local neck symptoms  I have attempted to call him, left a message to check portal message. The patient will be given the option to proceed with repeat radioactive iodine ablation versus restarting methimazole  Medication Continue diltiazem  120 mg daily  2. Graves' Disease:    - No extrathyroidal manifestations of thyroid disease.      F/U in 4 months     Signed electronically by: Lyndle Herrlich, MD  [facility]in[facility]  [facility] [address] [place] [place], [place] [address] Phone: [phone] FAX: [phone]      CC: Mliss Sax, MD [address] [place] [address] Phone: [phone]  Fax: [phone]   Return to Endocrinology clinic as below: Future Appointments  Date Time Provider Department Center  03/24/2022  1:20 PM Dio Giller, Konrad Dolores, MD [facility] None  05/31/2022  2:20 PM Mliss Sax, MD [facility]

## 2022-04-16 NOTE — Telephone Encounter (Signed)
error 

## 2022-04-29 ENCOUNTER — Encounter: Payer: Self-pay | Admitting: Family Medicine

## 2022-05-31 ENCOUNTER — Telehealth: Payer: Self-pay | Admitting: Family Medicine

## 2022-05-31 ENCOUNTER — Encounter: Payer: Self-pay | Admitting: Family Medicine

## 2022-05-31 NOTE — Telephone Encounter (Signed)
8.28.23 no show letter sent 

## 2022-06-01 NOTE — Telephone Encounter (Signed)
1st no show, fee waived, letter sent 

## 2022-09-14 ENCOUNTER — Encounter: Payer: Self-pay | Admitting: Internal Medicine

## 2022-09-14 ENCOUNTER — Ambulatory Visit (INDEPENDENT_AMBULATORY_CARE_PROVIDER_SITE_OTHER): Payer: BC Managed Care – PPO | Admitting: Internal Medicine

## 2022-09-14 VITALS — BP 120/80 | HR 60 | Ht 69.0 in | Wt 165.0 lb

## 2022-09-14 DIAGNOSIS — E0590 Thyrotoxicosis, unspecified without thyrotoxic crisis or storm: Secondary | ICD-10-CM | POA: Diagnosis not present

## 2022-09-14 DIAGNOSIS — E0500 Thyrotoxicosis with diffuse goiter without thyrotoxic crisis or storm: Secondary | ICD-10-CM

## 2022-09-14 LAB — T4, FREE: Free T4: 2.69 ng/dL — ABNORMAL HIGH (ref 0.60–1.60)

## 2022-09-14 LAB — TSH: TSH: <0.01 u[IU]/mL — ABNORMAL LOW (ref 0.35–5.50)

## 2022-09-14 NOTE — Progress Notes (Unsigned)
Name: Rodney [name]  MRN/ DOB: [id number], October 20, 2000    Age/ Sex: 21 y.o., male     PCP: Mliss Sax, MD   Reason for Endocrinology Evaluation: Hyperthyroidism     Initial Endocrinology Clinic Visit: 02/15/2020    PATIENT IDENTIFIER: Rodney [name] is a 21 y.o., male with a past medical history of Asthma and Hyperthyroidism He has followed with [facility] since 02/15/2020 for consultative assistance with management of his Hyperthyroidism.   HISTORICAL SUMMARY: Pt was diagnosed with hyperthyroidism in 12/2019 with a suppressed TSH of < 0.01 uIU/mL and elevated FT4 at > 5.50 ng/dL and elevated FT3 at 25.4 pg/mL during evaluation of asthma attack. He was noted to have thyromegaly at the time.    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  Maternal Grandmother and aunt with thyroid disease  SUBJECTIVE:    Today (09/14/2022):  Rodney [name] is here for a follow up on hyperthyroidism.  He has not been to our clinic in 10 months   He is S/P RAI ablation on 06/11/2021 with 12.6 mCi of I-131  He did not restart Diltiazem following his last visit here.  He has been noted with weight gain  Energy level is stable  Denies palpitations  Denies constipation or diarrhea  Denies local neck swelling  Slight  tremors present  Continue with heat intolerance  No itching or burning of the eyes  He is planning on having total thyroidectomy in [place]      HISTORY:  Past Medical History:  Past Medical History:  Diagnosis Date   Allergy to animal dander    Asthma    Tachycardia    Thyroid disease    Past Surgical History: No past surgical history on file. Social History:  reports that he has never smoked. He has never used smokeless  tobacco. He reports that he does not drink alcohol and does not use drugs. Family History:  Family History  Problem Relation Age of Onset   Healthy Mother    Healthy Father    Healthy Maternal Grandmother    Thyroid disease Maternal Grandmother    Healthy Maternal Grandfather    Healthy Paternal Grandmother    Healthy Paternal Grandfather      HOME MEDICATIONS: Allergies as of 09/14/2022   No Known Allergies      Medication List        Accurate as of September 14, 2022  9:30 AM. If you have any questions, ask your nurse or doctor.          STOP taking these medications    diltiazem 120 MG 24 hr capsule Commonly known as: dilTIAZem CD Stopped by: Scarlette Shorts, MD       TAKE these medications    albuterol 108 (90 Base) MCG/ACT inhaler Commonly known as: VENTOLIN HFA Inhale 2 puffs into the lungs every 6 (six) hours as needed for wheezing or shortness of breath.          OBJECTIVE:   PHYSICAL EXAM: VS:BP 120/80 (BP Location: Left Arm, Patient Position: Sitting, Cuff Size: Small)   Pulse 60   Ht 5\' 9"  (1.753 m)   Wt 165 lb (74.8 kg)   SpO2 98%   BMI 24.37 kg/m  EXAM: General: Pt appears well and is in NAD  Neck: General: Supple without adenopathy. Thyroid: Thyroid size is 30 grams.  No goiter or nodules appreciated.No bruits  Lungs: Clear with good BS bilat with no rales, rhonchi, or wheezes  Heart: Auscultation: RRR.  Abdomen: Normoactive bowel sounds, soft, nontender, without masses or organomegaly palpable  Extremities:  BL LE: No pretibial edema normal ROM and strength.  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  - TFT's today ***    2. Graves' Disease:    - No extrathyroidal manifestations of thyroid disease.      F/U in 6 months     Signed electronically by: Mack Guise, MD  [facility] Endocrinology  [facility] [address] [place], [place] [address] Phone: [phone] FAX: [phone]      CC: Libby Maw, [place] [address] Phone: [phone]  Fax: [phone]   Return to Endocrinology clinic as below: No future appointments.

## 2022-09-15 LAB — T3: T3, Total: 284 ng/dL — ABNORMAL HIGH (ref 76–181)

## 2022-09-15 MED ORDER — METHIMAZOLE 5 MG PO TABS: 5.0000 mg | ORAL_TABLET | Freq: Every day | ORAL | 1 refills | Status: DC

## 2022-10-02 ENCOUNTER — Other Ambulatory Visit: Payer: Self-pay | Admitting: Family Medicine

## 2022-10-02 DIAGNOSIS — J4520 Mild intermittent asthma, uncomplicated: Secondary | ICD-10-CM

## 2023-03-17 ENCOUNTER — Ambulatory Visit: Payer: BC Managed Care – PPO | Admitting: Internal Medicine

## 2023-03-17 NOTE — Progress Notes (Deleted)
Name: Rodney [name]  MRN/ DOB: [id number], [birth year]    Age/ Sex: 22 y.o., male     PCP: Mliss Sax, MD   Reason for Endocrinology Evaluation: Hyperthyroidism     Initial Endocrinology Clinic Visit: 02/15/2020    PATIENT IDENTIFIER: [name] is a 22 y.o., male with a past medical history of Asthma and Hyperthyroidism He has followed with [facility] since 02/15/2020 for consultative assistance with management of his Hyperthyroidism.   HISTORICAL SUMMARY: Pt was diagnosed with hyperthyroidism in 12/2019 with a suppressed TSH of < 0.01 uIU/mL and elevated FT4 at > 5.50 ng/dL and elevated FT3 at 78.2 pg/mL during evaluation of asthma attack. He was noted to have thyromegaly at the time.    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  Maternal Grandmother and aunt with thyroid disease  SUBJECTIVE:    Today (03/17/2023):  Rodney [name] is here for a follow up on hyperthyroidism.  He has not been to our clinic in 10 months   He is S/P RAI ablation on 06/11/2021 with 12.6 mCi of I-131  He did not restart Diltiazem following his last visit here.  He has been noted with weight gain  Energy level is stable  Denies palpitations  Denies constipation or diarrhea  Denies local neck swelling  Slight  tremors present  Continue with heat intolerance  No itching or burning of the eyes  He is planning on having [place]idectomy in [place]      HISTORY:  Past Medical History:  Past Medical History:  Diagnosis Date   Allergy to animal dander    Asthma    Tachycardia    Thyroid disease    Past Surgical History: No past surgical history on file. Social History:  reports that he has never smoked. He has never used smokeless  tobacco. He reports that he does not drink alcohol and does not use drugs. Family History:  Family History  Problem Relation Age of Onset   Healthy Mother    Healthy Father    Healthy Maternal Grandmother    Thyroid disease Maternal Grandmother    Healthy Maternal Grandfather    Healthy Paternal Grandmother    Healthy Paternal Grandfather      HOME MEDICATIONS: Allergies as of 03/17/2023   No Known Allergies      Medication List        Accurate as of March 17, 2023  7:05 AM. If you have any questions, ask your nurse or doctor.          albuterol 108 (90 Base) MCG/ACT inhaler Commonly known as: VENTOLIN HFA Inhale 2 puffs into the lungs every 6 (six) hours as needed for wheezing or shortness of breath.   methimazole 5 MG tablet Commonly known as: TAPAZOLE Take 1 tablet (5 mg total) by mouth daily.          OBJECTIVE:   PHYSICAL EXAM: NF:AOZHY were no vitals taken for this visit.     EXAM: General: Pt appears well and is in NAD  Neck: General: Supple without adenopathy. Thyroid: Thyroid size is 30 grams.  No goiter or nodules appreciated.No bruits  Lungs: Clear with good BS bilat with no rales, rhonchi, or wheezes  Heart: Auscultation: RRR.  Abdomen:  Normoactive bowel sounds, soft, nontender, without masses or organomegaly palpable  Extremities:  BL LE: No pretibial edema normal ROM and strength.  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  - TFT's today continues to show hyperthyroidism - Will restarted Methimazole in preparations for upcoming thyroidectomy    Medication  Start Methimazole 5 mg daily     2. Graves' Disease:    - No extrathyroidal manifestations of thyroid disease.      F/U in 6 months     Signed electronically by: Lyndle Herrlich, MD  [facility] Endocrinology  [facility] [address] [place] [place], [place] [address] Phone: [phone] FAX: [phone]      CC: Mliss Sax, MD [address] [place] [address] Phone: [phone]  Fax: [phone]   Return to Endocrinology clinic as below: Future Appointments  Date Time Provider Department Center  03/17/2023  9:30 AM Rozalyn Osland, Konrad Dolores, MD [facility] None

## 2023-09-22 ENCOUNTER — Encounter: Payer: Self-pay | Admitting: Internal Medicine

## 2023-09-22 ENCOUNTER — Ambulatory Visit (INDEPENDENT_AMBULATORY_CARE_PROVIDER_SITE_OTHER): Payer: BC Managed Care – PPO | Admitting: Internal Medicine

## 2023-09-22 VITALS — BP 118/74 | HR 64 | Ht 69.0 in | Wt 184.0 lb

## 2023-09-22 DIAGNOSIS — E0590 Thyrotoxicosis, unspecified without thyrotoxic crisis or storm: Secondary | ICD-10-CM

## 2023-09-22 DIAGNOSIS — E0500 Thyrotoxicosis with diffuse goiter without thyrotoxic crisis or storm: Secondary | ICD-10-CM | POA: Diagnosis not present

## 2023-09-22 NOTE — Progress Notes (Signed)
Name: Rodney [name]  MRN/ DOB: [id number], June 14, 2001    Age/ Sex: 22 y.o., male     PCP: Mliss Sax, MD   Reason for Endocrinology Evaluation: Hyperthyroidism     Initial Endocrinology Clinic Visit: 02/15/2020    PATIENT IDENTIFIER: [name] is a 22 y.o., male with a past medical history of Asthma and Hyperthyroidism He has followed with [facility] since 02/15/2020 for consultative assistance with management of his Hyperthyroidism.   HISTORICAL SUMMARY: Pt was diagnosed with hyperthyroidism in 12/2019 with a suppressed TSH of < 0.01 uIU/mL and elevated FT4 at > 5.50 ng/dL and elevated FT3 at 40.9 pg/mL during evaluation of asthma attack. He was noted to have thyromegaly at the time.    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  Maternal Grandmother and aunt with thyroid disease  SUBJECTIVE:    Today (09/22/2023):  Rodney [name] is here for a follow up on hyperthyroidism.  He has not been to our clinic in 10 months   He is S/P RAI ablation on 06/11/2021 with 12.6 mCi of I-131 He was scheduled for total thyroidectomy but per pt this was canceled by the surgeon  Patient continues with weight gain Denies local neck swelling He does endorse occasional palpitations Denies constipation or diarrhea  No itching or burning of the eyes Has noted increase appetite   He is a father now with a 41 month old girl   Methimazole 5 mg daily    HISTORY:  Past Medical History:  Past Medical History:  Diagnosis Date   Allergy to animal dander    Asthma    Tachycardia    Thyroid disease    Past Surgical History: No past surgical history on file. Social History:  reports that he has never smoked. He  has never used smokeless tobacco. He reports that he does not drink alcohol and does not use drugs. Family History:  Family History  Problem Relation Age of Onset   Healthy Mother    Healthy Father    Healthy Maternal Grandmother    Thyroid disease Maternal Grandmother    Healthy Maternal Grandfather    Healthy Paternal Grandmother    Healthy Paternal Grandfather      HOME MEDICATIONS: Allergies as of 09/22/2023   No Known Allergies      Medication List        Accurate as of September 22, 2023 10:11 AM. If you have any questions, ask your nurse or doctor.          albuterol 108 (90 Base) MCG/ACT inhaler Commonly known as: VENTOLIN HFA Inhale 2 puffs into the lungs every 6 (six) hours as needed for wheezing or shortness of breath.   methimazole 5 MG tablet Commonly known as: TAPAZOLE Take 1 tablet (5 mg total) by mouth daily.          OBJECTIVE:   PHYSICAL EXAM: VS:BP 118/74 (BP Location: Left Arm, Patient Position: Sitting, Cuff Size: Normal)   Pulse 64   Ht 5\' 9"  (1.753 m)   Wt 184 lb (83.5 kg)   SpO2 97%   BMI 27.17 kg/m      EXAM: General: Pt appears well and is in NAD  Neck: General: Supple without  adenopathy. Thyroid: Thyroid size is 30 grams.  No goiter or nodules appreciated.No bruits  Lungs: Clear with good BS bilat with no rales, rhonchi, or wheezes  Heart: Auscultation: RRR.  Abdomen: Normoactive bowel sounds, soft, nontender, without masses or organomegaly palpable  Extremities:  BL LE: No pretibial edema normal ROM and strength.  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 Graves' Disease:    - No extrathyroidal manifestations of thyroid disease.      F/U in 6 months   Addendum: Discussed lab results with the patient 09/23/2023 at 1600, patient will return February 20 for repeat labs  Signed electronically by: Lyndle Herrlich, MD  [facility] Endocrinology  [facility] [address] [place], [place] [address] Phone: [phone] FAX: [phone]      CC: Mliss Sax, MD [address] [place] [address] Phone: [phone]  Fax: [phone]   Return to Endocrinology clinic as below: No future appointments.

## 2023-09-23 LAB — T4, FREE: Free T4: 2.8 ng/dL — ABNORMAL HIGH (ref 0.8–1.8)

## 2023-09-23 LAB — TSH: TSH: 0.01 m[IU]/L — ABNORMAL LOW (ref 0.40–4.50)

## 2023-09-23 LAB — T3, FREE: T3, Free: 10.5 pg/mL — ABNORMAL HIGH (ref 2.3–4.2)

## 2023-09-23 MED ORDER — METHIMAZOLE 5 MG PO TABS: 10.0000 mg | ORAL_TABLET | Freq: Every day | ORAL | 3 refills | Status: DC

## 2023-10-03 ENCOUNTER — Encounter (HOSPITAL_COMMUNITY): Payer: Self-pay

## 2023-10-03 ENCOUNTER — Ambulatory Visit (HOSPITAL_COMMUNITY)
Admission: EM | Admit: 2023-10-03 | Discharge: 2023-10-03 | Disposition: A | Discharge status: Home / Self Care | Payer: BC Managed Care – PPO | Attending: Internal Medicine | Admitting: Internal Medicine

## 2023-10-03 DIAGNOSIS — J09X2 Influenza due to identified novel influenza A virus with other respiratory manifestations: Secondary | ICD-10-CM | POA: Insufficient documentation

## 2023-10-03 LAB — POCT INFLUENZA A/B
Influenza A, POC: POSITIVE — AB
Influenza B, POC: NEGATIVE

## 2023-10-03 MED ORDER — OSELTAMIVIR PHOSPHATE 75 MG PO CAPS: 75.0000 mg | ORAL_CAPSULE | Freq: Two times a day (BID) | ORAL | 0 refills | Status: DC

## 2023-10-03 NOTE — ED Triage Notes (Signed)
Patient having headache, scratchy throat, fatigue, dizzy, slight cough, and nasal congestion. No known sick exposure. Negative home COVID test today. Onset of symptoms yesterday afternoon.   Patient tried Mucinex Fast Max with slight relief.

## 2023-10-03 NOTE — Discharge Instructions (Addendum)
Your flu A test is positive. The Flu B and Covid test are negative.  You may continue taking the Mucinex for your symptoms Keep your mask on when around other people and make sure to wash your hands when you clean your nose. Cough on your arm, so you dont spread this virus with your hands.

## 2023-10-03 NOTE — ED Provider Notes (Signed)
[facility]    CSN: [id number] Arrival date & time: 10/03/23  1040      History   Chief Complaint Chief Complaint  Patient presents with   URI   Headache    HPI Rodney [name] is a 22 y.o. male who presents with scratchy throat, fatigue, dizziness described as lightheaded when he would get up from sitting which lasted only a few seconds , slight cough and nose congestion and HA since yesterday pm. Did a home covid test which was negative. While at work he was chilling, got sweaty, though he felt hot and could only work for 4 hours and had to leave early.  While at work without an injury he started aching and is gone today. Today his throat feels itchy, has post nasal drainage, and cough is mild and non productive. He has taken Mucinex this am 8 am.     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 No pertinent surgical history.     Home Medications    Prior to Admission medications   Medication Sig Start Date End Date Taking? Authorizing Provider  albuterol (VENTOLIN HFA) 108 (90 Base) MCG/ACT inhaler Inhale 2 puffs into the lungs every 6 (six) hours as needed for wheezing or shortness of breath. 04/23/21  Yes Mliss Sax, MD  methimazole (TAPAZOLE) 5 MG tablet Take 2 tablets (10 mg total) by mouth daily. 09/23/23  Yes Shamleffer, Konrad Dolores, MD  oseltamivir (TAMIFLU) 75 MG capsule Take 1 capsule (75 mg total) by mouth every 12 (twelve) hours.  10/03/23  Yes Rodriguez-Southworth, Nettie Elm, PA-C    Family History Family History  Problem Relation Age of Onset   Healthy Mother    Healthy Father    Healthy Maternal Grandmother    Thyroid disease Maternal Grandmother    Healthy Maternal Grandfather    Healthy Paternal Grandmother    Healthy Paternal Grandfather     Social History Social History   Tobacco Use   Smoking status: Never   Smokeless tobacco: Never  Vaping Use   Vaping status: Never Used  Substance Use Topics   Alcohol use: No   Drug use: No     Allergies   Patient has no known allergies.   Review of Systems Review of Systems As noted in HPI  Physical Exam Triage Vital Signs ED Triage Vitals  Encounter Vitals Group     BP 10/03/23 1144 (!) 107/59     Systolic BP Percentile --      Diastolic BP Percentile --      Pulse Rate 10/03/23 1144 95     Resp 10/03/23 1144 16     Temp 10/03/23 1144 98.6 F (37 C)     Temp Source 10/03/23 1144 Oral     SpO2 10/03/23 1144 97 %     Weight 10/03/23 1143 184 lb (83.5 kg)     Height 10/03/23 1143 5\' 10"  (1.778 m)     Head Circumference --  Peak Flow --      Pain Score 10/03/23 1143 0     Pain Loc --      Pain Education --      Exclude from Growth Chart --    No data found.  Updated Vital Signs BP (!) 107/59 (BP Location: Right Arm)   Pulse 95   Temp 98.6 F (37 C) (Oral)   Resp 16   Ht 5\' 10"  (1.778 m)   Wt 184 lb (83.5 kg)   SpO2 97%   BMI 26.40 kg/m   Visual Acuity Right Eye Distance:   Left Eye Distance:   Bilateral Distance:    Right Eye Near:   Left Eye Near:    Bilateral Near:     Physical Exam Physical Exam Vitals signs and nursing note reviewed.  Constitutional:      General: he is not in acute distress.    Appearance: Normal appearance. He is not ill-appearing, toxic-appearing or diaphoretic.  HENT:     Head: Normocephalic.     Right Ear: Tympanic membrane, ear canal and external ear normal.     Left Ear: Tympanic  membrane, ear canal and external ear normal.     Nose: Nose normal.     Mouth/Throat: clear    Mouth: Mucous membranes are moist.  Eyes:     General: No scleral icterus.       Right eye: No discharge.        Left eye: No discharge.     Conjunctiva/sclera: Conjunctivae normal.  Neck:     Musculoskeletal: Neck supple. No neck rigidity.  Cardiovascular:     Rate and Rhythm: Normal rate and regular rhythm.     Heart sounds: No murmur.  Pulmonary:     Effort: Pulmonary effort is normal.     Breath sounds: Normal breath sounds.   Musculoskeletal: Normal range of motion.  Lymphadenopathy:     Cervical: No cervical adenopathy.  Skin:    General: Skin is warm and dry.     Coloration: Skin is not jaundiced.     Findings: No rash.  Neurological:     Mental Status: he is alert and oriented to person, place, and time.     Gait: Gait normal.  Psychiatric:        Mood and Affect: Mood normal.        Behavior: Behavior normal.        Thought Content: Thought content normal.        Judgment: Judgment normal.    UC Treatments / Results  Labs (all labs ordered are listed, but only abnormal results are displayed) Labs Reviewed  POCT INFLUENZA A/B - Abnormal; Notable for the following components:      Result Value   Influenza A, POC Positive (*)    All other components within normal limits  SARS CORONAVIRUS 2 (TAT 6-24 HRS)  Flu B and Covid test are negative  EKG   Radiology No results found.  Procedures Procedures (including critical care time)  Medications Ordered in UC Medications - No data to display  Initial Impression / Assessment and Plan / UC Course  I have reviewed the triage vital signs and the nursing notes.  Pertinent labs  results that were available during my care of the patient were reviewed by me and considered in my medical decision making (see chart for details).  Influenza A  He was paled on Tamiflu as noted. See instructions.    Final Clinical  Impressions(s) /  UC Diagnoses   Final diagnoses:  Influenza due to identified novel influenza A virus with other respiratory manifestations     Discharge Instructions      Your flu A test is positive. The Flu B and Covid test are negative.  You may continue taking the Mucinex for your symptoms Keep your mask on when around other people and make sure to wash your hands when you clean your nose. Cough on your arm, so you dont spread this virus with your hands.      ED Prescriptions     Medication Sig Dispense Auth. Provider   oseltamivir (TAMIFLU) 75 MG capsule Take 1 capsule (75 mg total) by mouth every 12 (twelve) hours. 10 capsule Rodriguez-Southworth, Nettie Elm, PA-C      PDMP not reviewed this encounter.   Garey Ham, PA-C 10/03/23 1257

## 2023-10-04 LAB — SARS CORONAVIRUS 2 (TAT 6-24 HRS): SARS Coronavirus 2: NEGATIVE

## 2023-11-24 ENCOUNTER — Other Ambulatory Visit: Payer: BC Managed Care – PPO

## 2023-11-24 DIAGNOSIS — E0590 Thyrotoxicosis, unspecified without thyrotoxic crisis or storm: Secondary | ICD-10-CM | POA: Diagnosis not present

## 2023-11-24 LAB — TSH: TSH: <0.01 m[IU]/L — ABNORMAL LOW (ref 0.40–4.50)

## 2023-11-24 LAB — T4, FREE: Free T4: 2.8 ng/dL — ABNORMAL HIGH (ref 0.8–1.8)

## 2023-11-25 ENCOUNTER — Telehealth: Payer: Self-pay | Admitting: Internal Medicine

## 2023-11-25 MED ORDER — METHIMAZOLE 5 MG PO TABS: 15.0000 mg | ORAL_TABLET | Freq: Every day | ORAL | 3 refills | Status: DC

## 2023-11-25 NOTE — Telephone Encounter (Signed)
 Patient was advise to continue taking 2 tab daily after he states that he has not taking the medication for the last 6 days.  Patient aware of the important of not skipping doses.

## 2023-11-25 NOTE — Telephone Encounter (Signed)
 Please let the patient know that his thyroid test continues to show overactivity.    Please make sure that the patient is compliant with taking methimazole, he may need to use a pillbox if he tends to forget to take it    Please asked the patient to INCREASE methimazole to 3 tablets daily from now on   Thanks

## 2024-02-27 ENCOUNTER — Ambulatory Visit (HOSPITAL_COMMUNITY)
Admission: EM | Admit: 2024-02-27 | Discharge: 2024-02-27 | Disposition: A | Discharge status: Home / Self Care | Attending: Physician Assistant | Admitting: Physician Assistant

## 2024-02-27 ENCOUNTER — Encounter (HOSPITAL_COMMUNITY): Payer: Self-pay

## 2024-02-27 DIAGNOSIS — J069 Acute upper respiratory infection, unspecified: Secondary | ICD-10-CM | POA: Diagnosis not present

## 2024-02-27 DIAGNOSIS — R0981 Nasal congestion: Secondary | ICD-10-CM | POA: Insufficient documentation

## 2024-02-27 LAB — POC COVID19/FLU A&B COMBO
Covid Antigen, POC: NEGATIVE
Influenza A Antigen, POC: NEGATIVE
Influenza B Antigen, POC: NEGATIVE

## 2024-02-27 LAB — POCT RAPID STREP A (OFFICE): Rapid Strep A Screen: NEGATIVE

## 2024-02-27 MED ORDER — IPRATROPIUM BROMIDE 0.03 % NA SOLN: 2.0000 | Freq: Two times a day (BID) | NASAL | 0 refills | Status: AC

## 2024-02-27 MED ORDER — PROMETHAZINE-DM 6.25-15 MG/5ML PO SYRP: 5.0000 mL | ORAL_SOLUTION | Freq: Three times a day (TID) | ORAL | 0 refills | Status: AC | PRN

## 2024-02-27 MED ORDER — IBUPROFEN 600 MG PO TABS: 600.0000 mg | ORAL_TABLET | Freq: Three times a day (TID) | ORAL | 0 refills | Status: AC | PRN

## 2024-02-27 NOTE — ED Triage Notes (Signed)
 Patient reports that he has has a cough, sore throat, and nasal congestion x 3 days.  Patient states he has taken Dayquil,Mucinex, cough drops, and Tylenol.

## 2024-02-27 NOTE — Discharge Instructions (Addendum)
 You tested negative for COVID, flu, strep.  I will contact you if your strep culture grows any bacteria and we need to start antibiotics.  Take ibuprofen for pain.  Do not take additional NSAIDs with this medication including aspirin, ibuprofen/Advil, naproxen/Aleve.  Use ipratropium nasal spray to help with your congestion.  I also recommend nasal saline and sinus rinses.  Take Promethazine DM for cough.  This will make you sleepy so do not drive or drink alcohol with taking it.  Make sure that you rest and drink plenty of fluid.  If anything worsens and you have high fever, worsening cough, shortness of breath that is not responding to your albuterol , having to use your albuterol  regularly, chest pain, weakness you need to be seen immediately.

## 2024-02-27 NOTE — ED Provider Notes (Signed)
 [facility]    CSN: [id number] Arrival date & time: 02/27/24  1132      History   Chief Complaint Chief Complaint  Patient presents with   Sore Throat   Cough   Nasal Congestion    HPI Rodney [name] is a 23 y.o. male.   Patient presents today with a 3-day history of URI symptoms.  He reports cough, sore throat, nasal congestion.  Denies any fever, chest pain, shortness of breath, nausea, vomiting, diarrhea.  Denies any known sick contacts.  He has had COVID with last episode several years ago.  He has not had COVID-19 vaccinations.  He has been taking Mucinex, cold and flu medication, DayQuil, cough drops, Tylenol with temporary improvement of symptoms.  Denies any recent antibiotics or steroids.  He does have a history of asthma and has used albuterol  inhaler 1 time since his symptoms began.  Reports hospitalization for asthma as a young child but not anytime recently.  He does not take any maintenance asthma medicine and denies any recent antibiotics or steroids.  He is having difficulty with daily duties as a result of symptoms.    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 No pertinent surgical history.     Home Medications    Prior to Admission medications   Medication Sig Start Date End Date Taking? Authorizing Provider  ibuprofen (ADVIL) 600 MG tablet Take 1 tablet (600 mg total) by mouth every 8 (eight) hours as needed. 02/27/24  Yes Fatina Sprankle K,  PA-C  ipratropium (ATROVENT ) 0.03 % nasal spray Place 2 sprays into both nostrils every 12 (twelve) hours. 02/27/24  Yes Harmonie Verrastro K, PA-C  promethazine-dextromethorphan (PROMETHAZINE-DM) 6.25-15 MG/5ML syrup Take 5 mLs by mouth 3 (three) times daily as needed for cough. 02/27/24  Yes Tasmine Hipwell, [place] Dales K, PA-C  albuterol  (VENTOLIN  HFA) 108 (90 Base) MCG/ACT inhaler Inhale 2 puffs into the lungs every 6 (six) hours as needed for wheezing or shortness of breath. 04/23/21   Tonna Frederic, MD  methimazole  (TAPAZOLE ) 5 MG tablet Take 3 tablets (15 mg total) by mouth daily. 11/25/23   Shamleffer, Julian Obey, MD    Family History Family History  Problem Relation Age of Onset   Healthy Mother    Healthy Father    Healthy Maternal Grandmother    Thyroid  disease Maternal Grandmother    Healthy Maternal Grandfather    Healthy Paternal Grandmother    Healthy Paternal Grandfather     Social History Social History   Tobacco Use   Smoking status: Never   Smokeless tobacco: Never  Vaping Use   Vaping status: Never Used  Substance Use Topics   Alcohol use: Yes   Drug use: No     Allergies   Patient has no known allergies.   Review of Systems Review of Systems  Constitutional:  Positive for activity change. Negative for appetite change, fatigue and fever.  HENT:  Positive for congestion, postnasal drip and  sore throat. Negative for sinus pressure and sneezing.   Respiratory:  Positive for cough. Negative for shortness of breath.   Cardiovascular:  Negative for chest pain.  Gastrointestinal:  Negative for abdominal pain, diarrhea, nausea and vomiting.  Neurological:  Negative for dizziness, light-headedness and headaches.     Physical Exam Triage Vital Signs ED Triage Vitals [02/27/24 1344]  Encounter Vitals Group     BP 117/82     Systolic BP Percentile      Diastolic BP Percentile      Pulse Rate 89     Resp 16     Temp 98.2 F (36.8 C)     Temp Source Oral     SpO2  97 %     Weight      Height      Head Circumference      Peak Flow      Pain Score 9     Pain Loc      Pain Education      Exclude from Growth Chart    No data found.  Updated Vital Signs BP 117/82 (BP Location: Left Arm)   Pulse 89   Temp 98.2 F (36.8 C) (Oral)   Resp 16   SpO2 97%   Visual Acuity Right Eye Distance:   Left Eye Distance:   Bilateral Distance:    Right Eye Near:   Left Eye Near:    Bilateral Near:     Physical Exam Vitals reviewed.  Constitutional:      General: He is awake.     Appearance: Normal appearance. He is well-developed. He is not ill-appearing.     Comments: Very pleasant male appears stated age in no acute distress sitting comfortably in exam room  HENT:     Head: Normocephalic and atraumatic.     Right Ear: Tympanic membrane, ear canal and external ear normal. Tympanic membrane is not erythematous or bulging.     Left Ear: Ear canal and external ear normal. Tympanic membrane is injected. Tympanic membrane is not erythematous or bulging.     Nose: Nose normal.     Mouth/Throat:     Pharynx: Uvula midline. Posterior oropharyngeal erythema present. No oropharyngeal exudate or uvula swelling.     Tonsils: No tonsillar exudate or tonsillar abscesses.  Cardiovascular:     Rate and Rhythm: Normal rate and regular rhythm.     Heart sounds: Normal heart sounds, S1 normal and S2 normal. No murmur heard. Pulmonary:     Effort: Pulmonary effort is normal. No accessory muscle usage or respiratory distress.     Breath sounds: Normal breath sounds. No stridor. No wheezing, rhonchi or rales.     Comments: Clear to auscultation bilaterally Lymphadenopathy:     Head:     Right side of head: No submental, submandibular or tonsillar adenopathy.     Left side of head: No submental, submandibular or tonsillar adenopathy.     Cervical: No cervical adenopathy.  Neurological:     Mental Status: He is alert.  Psychiatric:        Behavior: Behavior is  cooperative.   Very pleasant male   UC Treatments / Results  Labs (all labs ordered are listed, but only abnormal results are displayed) Labs Reviewed  CULTURE, GROUP A STREP [facility])  POCT RAPID STREP A (OFFICE)  POC COVID19/FLU A&B COMBO    EKG   Radiology No results found.  Procedures Procedures (including critical care time)  Medications Ordered in UC  Medications - No data to display  Initial Impression / Assessment and Plan / UC Course  I have reviewed the triage vital signs and the nursing notes.  Pertinent labs & imaging results that were available during my care of the patient were reviewed by me and considered in my medical decision making (see chart for details).     Patient is well-appearing, afebrile, nontoxic, nontachycardic.  No evidence of acute infection on physical exam that warrant initiation of antibiotics.  Viral testing for COVID and flu was negative.  Strep testing was also obtained given severity of sore throat with significant erythema on exam that was negative.  Will send this for culture but defer antibiotics until culture results are available.  Low suspicion for asthma exacerbation as patient denies regular use of his albuterol  and has no wheezing on exam.  Chest x-ray was deferred as he had no adventitious lung sounds and his oxygen saturation was appropriate at 97%.  Will treat symptomatically and he was given Promethazine DM for cough.  Discussed that this can be sedating and he is not to drive or drink alcohol with taking it.  He was given ipratropium nasal spray to manage rhinorrhea and ibuprofen 600 mg for discomfort.  Discussed that he is not to take NSAIDs with this medication to risk of GI bleeding.  He can use his albuterol  as previously prescribed and we discussed that if he is having to use this regularly or if he has symptoms that are not responding to this medication he needs to return for reevaluation.  Strict return precautions were given.   Excuse note provided.  Final Clinical Impressions(s) / UC Diagnoses   Final diagnoses:  Viral URI with cough  Nasal congestion     Discharge Instructions      You tested negative for COVID, flu, strep.  I will contact you if your strep culture grows any bacteria and we need to start antibiotics.  Take ibuprofen for pain.  Do not take additional NSAIDs with this medication including aspirin, ibuprofen/Advil, naproxen/Aleve.  Use ipratropium nasal spray to help with your congestion.  I also recommend nasal saline and sinus rinses.  Take Promethazine DM for cough.  This will make you sleepy so do not drive or drink alcohol with taking it.  Make sure that you rest and drink plenty of fluid.  If anything worsens and you have high fever, worsening cough, shortness of breath that is not responding to your albuterol , having to use your albuterol  regularly, chest pain, weakness you need to be seen immediately.   ED Prescriptions     Medication Sig Dispense Auth. Provider   ipratropium (ATROVENT ) 0.03 % nasal spray Place 2 sprays into both nostrils every 12 (twelve) hours. 30 mL Ashlynn Gunnels K, PA-C   promethazine-dextromethorphan (PROMETHAZINE-DM) 6.25-15 MG/5ML syrup Take 5 mLs by mouth 3 (three) times daily as needed for cough. 118 mL Carston Riedl K, PA-C   ibuprofen (ADVIL) 600 MG tablet Take 1 tablet (600 mg total) by mouth every 8 (eight) hours as needed. 15 tablet Irania Durell K, PA-C      PDMP not reviewed this encounter.   Budd Cargo, PA-C 02/27/24 1434

## 2024-02-29 ENCOUNTER — Ambulatory Visit (HOSPITAL_COMMUNITY): Payer: Self-pay

## 2024-02-29 LAB — CULTURE, GROUP A STREP (THRC)

## 2024-03-23 ENCOUNTER — Encounter: Payer: Self-pay | Admitting: Internal Medicine

## 2024-03-23 ENCOUNTER — Ambulatory Visit: Payer: BC Managed Care – PPO | Admitting: Internal Medicine

## 2024-03-23 VITALS — BP 116/78 | HR 74 | Ht 70.0 in | Wt 193.0 lb

## 2024-03-23 DIAGNOSIS — E0500 Thyrotoxicosis with diffuse goiter without thyrotoxic crisis or storm: Secondary | ICD-10-CM | POA: Diagnosis not present

## 2024-03-23 DIAGNOSIS — E0590 Thyrotoxicosis, unspecified without thyrotoxic crisis or storm: Secondary | ICD-10-CM | POA: Diagnosis not present

## 2024-03-23 NOTE — Progress Notes (Unsigned)
 Name: Rodney [name]  MRN/ DOB: [id number], Aug 22, 2001    Age/ Sex: 23 y.o., male     PCP: Tonna Frederic, MD   Reason for Endocrinology Evaluation: Hyperthyroidism     Initial Endocrinology Clinic Visit: 02/15/2020    PATIENT IDENTIFIER: Rodney [name] is a 23 y.o., male with a past medical history of Asthma and Hyperthyroidism Rodney [name] has followed with [facility] since 02/15/2020 for consultative assistance with management of his Hyperthyroidism.   HISTORICAL SUMMARY: Pt was diagnosed with hyperthyroidism in 12/2019 with a suppressed TSH of < 0.01 uIU/mL and elevated FT4 at > 5.50 ng/dL and elevated FT3 at 29.5 pg/mL during evaluation of asthma attack. Rodney [name] was noted to have thyromegaly at the time.    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  Maternal Grandmother and aunt with thyroid  disease  SUBJECTIVE:    Today (03/23/2024):  Rodney [name] is here for a follow up on hyperthyroidism.    Rodney [name] is S/P RAI ablation on 06/11/2021 with 12.6 mCi of I-131 Rodney [name] was scheduled for total thyroidectomy but per pt this was canceled by the surgeon    Patient continues with weight gain No local neck swelling  Denies palpitations  Denies constipation or diarrhea No eye symptoms  No tremors   Rodney [name] is a father now with a 24 month old girl   Methimazole  5 mg, 3 tabs  daily    HISTORY:  Past Medical History:  Past Medical History:  Diagnosis Date   Allergy to animal dander    Asthma    Tachycardia    Thyroid  disease    Past Surgical History: No past surgical history on file. Social History:  reports that Rodney [name] has never smoked. Rodney [name] has never used smokeless tobacco. Rodney [name] reports current alcohol use. Rodney [name] reports that Rodney [name] does  not use drugs. Family History:  Family History  Problem Relation Age of Onset   Healthy Mother    Healthy Father    Healthy Maternal Grandmother    Thyroid  disease Maternal Grandmother    Healthy Maternal Grandfather    Healthy Paternal Grandmother    Healthy Paternal Grandfather      HOME MEDICATIONS: Allergies as of 03/23/2024   No Known Allergies      Medication List        Accurate as of March 23, 2024  6:49 AM. If you have any questions, ask your nurse or doctor.          albuterol  108 (90 Base) MCG/ACT inhaler Commonly known as: VENTOLIN  HFA Inhale 2 puffs into the lungs every 6 (six) hours as needed for wheezing or shortness of breath.   ibuprofen  600 MG tablet Commonly known as: ADVIL  Take 1 tablet (600 mg total) by mouth every 8 (eight) hours as needed.   ipratropium 0.03 % nasal spray Commonly known as: ATROVENT  Place 2 sprays into both nostrils every 12 (twelve) hours.   methimazole  5 MG tablet Commonly known as: TAPAZOLE  Take 3 tablets (15 mg total) by mouth daily.   promethazine -dextromethorphan 6.25-15 MG/5ML syrup Commonly known as: PROMETHAZINE -DM Take 5 mLs by mouth 3 (three) times daily as needed for cough.          OBJECTIVE:   PHYSICAL EXAM: MW:UXLKG  were no vitals taken for this visit.     EXAM: General: Pt appears well and is in NAD  Neck: General: Supple without adenopathy. Thyroid : Thyroid  size is 30 grams.  No goiter or nodules appreciated.No bruits  Lungs: Clear with good BS bilat with no rales, rhonchi, or wheezes  Heart: Auscultation: RRR.  Abdomen: Normoactive bowel sounds, soft, nontender, without masses or organomegaly palpable  Extremities:  BL LE: No pretibial edema normal ROM and strength.  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 Graves' Disease:    - No extrathyroidal manifestations of thyroid  disease.      F/U in 6 months   Addendum: Discussed lab results with the patient 09/23/2023 at 1600, patient will return February 20 for repeat labs  Signed electronically by: Natale Bail, MD  [facility]  [facility] [address] [place] [place], [place] [address] Phone: [phone] FAX: [phone]      CC: Tonna Frederic, MD [address] [place] [address] Phone: [phone]  Fax: [phone]   Return to Endocrinology clinic as below: Future Appointments  Date Time Provider Department Center  03/23/2024  7:30 AM Brya Simerly, Julian Obey, MD [id number] None

## 2024-03-24 LAB — TSH: TSH: 22.21 m[IU]/L — ABNORMAL HIGH (ref 0.40–4.50)

## 2024-03-24 LAB — T4, FREE: Free T4: 0.9 ng/dL (ref 0.8–1.8)

## 2024-03-26 ENCOUNTER — Ambulatory Visit: Payer: Self-pay | Admitting: Internal Medicine

## 2024-03-26 DIAGNOSIS — E0500 Thyrotoxicosis with diffuse goiter without thyrotoxic crisis or storm: Secondary | ICD-10-CM

## 2024-03-26 DIAGNOSIS — E0590 Thyrotoxicosis, unspecified without thyrotoxic crisis or storm: Secondary | ICD-10-CM

## 2024-03-26 MED ORDER — METHIMAZOLE 5 MG PO TABS: 5.0000 mg | ORAL_TABLET | Freq: Every day | ORAL | 3 refills | Status: DC

## 2024-03-26 NOTE — Telephone Encounter (Signed)
 Please contact the patient and let him know that 3 tablets of methimazole  a day is too much..  He needs to hold methimazole  for 3 days, then restart at 1 tablets a day   Please schedule him for repeat thyroid  testing in 1 month   Thank

## 2024-04-26 ENCOUNTER — Other Ambulatory Visit

## 2024-04-26 DIAGNOSIS — E0590 Thyrotoxicosis, unspecified without thyrotoxic crisis or storm: Secondary | ICD-10-CM | POA: Diagnosis not present

## 2024-04-27 LAB — T4, FREE: Free T4: 1.1 ng/dL (ref 0.8–1.8)

## 2024-04-27 LAB — TSH: TSH: 1.04 m[IU]/L (ref 0.40–4.50)

## 2024-08-02 ENCOUNTER — Encounter: Payer: Self-pay | Admitting: Internal Medicine

## 2024-08-02 ENCOUNTER — Other Ambulatory Visit

## 2024-08-02 ENCOUNTER — Ambulatory Visit: Admitting: Internal Medicine

## 2024-08-02 VITALS — BP 118/80 | Ht 70.0 in | Wt 194.0 lb

## 2024-08-02 DIAGNOSIS — E0500 Thyrotoxicosis with diffuse goiter without thyrotoxic crisis or storm: Secondary | ICD-10-CM | POA: Diagnosis not present

## 2024-08-02 DIAGNOSIS — E0590 Thyrotoxicosis, unspecified without thyrotoxic crisis or storm: Secondary | ICD-10-CM

## 2024-08-02 NOTE — Progress Notes (Unsigned)
 Name: Rodney [name]  MRN/ DOB: [id number], 12/08/2000    Age/ Sex: 23 y.o., male     PCP: Berneta Elsie Sayre, MD   Reason for Endocrinology Evaluation: Hyperthyroidism     Initial Endocrinology Clinic Visit: 02/15/2020    PATIENT IDENTIFIER: Rodney [name] is a 23 y.o., male with a past medical history of Asthma and Hyperthyroidism He has followed with [facility] since 02/15/2020 for consultative assistance with management of his Hyperthyroidism.   HISTORICAL SUMMARY: Pt was diagnosed with hyperthyroidism in 12/2019 with a suppressed TSH of < 0.01 uIU/mL and elevated FT4 at > 5.50 ng/dL and elevated FT3 at 71.8 pg/mL during evaluation of asthma attack. He was noted to have thyromegaly at the time.    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  Maternal Grandmother and aunt with thyroid  disease  SUBJECTIVE:    Today (08/02/2024):  [name] is here for a follow up on hyperthyroidism.    He is S/P RAI ablation on 06/11/2021 with 12.6 mCi of I-131 He was scheduled for total thyroidectomy but per pt this was canceled by the surgeon  No local neck swelling  No palpitations No tremors  No constipation or diarrhea  No eye symptoms   His daughter is 1 yr 10/6th    Methimazole  5 mg, 1 tab  daily    HISTORY:  Past Medical History:  Past Medical History:  Diagnosis Date  . 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 Asthma   . Tachycardia   . Thyroid  disease    Past Surgical History: No past surgical history on file. Social History:  reports that he has never smoked. He has never used smokeless tobacco. He reports current alcohol use. He reports that he does not use drugs. Family History:  Family History   Problem Relation Age of Onset  . Healthy Mother   . Healthy Father   . Healthy Maternal Grandmother   . Thyroid  disease Maternal Grandmother   . Healthy Maternal Grandfather   . Healthy Paternal Grandmother   . Healthy Paternal Grandfather      HOME MEDICATIONS: Allergies as of 08/02/2024   No Known Allergies      Medication List        Accurate as of August 02, 2024  8:20 AM. If you have any questions, ask your nurse or doctor.          albuterol  108 (90 Base) MCG/ACT inhaler Commonly known as: VENTOLIN  HFA Inhale 2 puffs into the lungs every 6 (six) hours as needed for wheezing or shortness of breath.   ibuprofen  600 MG tablet Commonly known as: ADVIL  Take 1 tablet (600 mg total) by mouth every 8 (eight) hours as needed.   ipratropium 0.03 % nasal spray Commonly known as: ATROVENT  Place 2 sprays into both nostrils every 12 (twelve) hours.   methimazole  5 MG tablet Commonly known as: TAPAZOLE  Take 1 tablet (5 mg total) by mouth daily.   promethazine -dextromethorphan 6.25-15 MG/5ML syrup Commonly known as: PROMETHAZINE -DM Take 5 mLs by mouth 3 (three) times daily as needed for cough.          OBJECTIVE:   PHYSICAL EXAM: VS:BP 118/80   Ht 5' 10 (1.778 m)   Wt  194 lb (88 kg)   BMI 27.84 kg/m    EXAM: General: Pt appears well and is in NAD  Neck: General: Supple without adenopathy. Thyroid : Thyroid  normal.  No goiter or nodules appreciated.No bruits  Lungs: Clear with good BS bilat   Heart: Auscultation: RRR.  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 Graves' Disease:    - No extrathyroidal manifestations of thyroid  disease.      F/U in 6 months    Signed electronically by: Stefano Redgie Butts, MD  [facility]  [facility] [address] [place], [place] [address] Phone: [phone] FAX: [phone]      CC: Berneta Elsie Sayre, MD [address] [place] [address] Phone: [phone]  Fax: [phone]   Return to Endocrinology clinic as below: No future appointments.

## 2024-08-03 ENCOUNTER — Ambulatory Visit: Payer: Self-pay | Admitting: Internal Medicine

## 2024-08-03 LAB — TSH: TSH: <0.01 m[IU]/L — ABNORMAL LOW (ref 0.40–4.50)

## 2024-08-03 LAB — T4, FREE: Free T4: 1.5 ng/dL (ref 0.8–1.8)

## 2024-08-03 MED ORDER — METHIMAZOLE 5 MG PO TABS: 10.0000 mg | ORAL_TABLET | Freq: Every day | ORAL | 1 refills | Status: AC

## 2024-08-03 NOTE — Telephone Encounter (Signed)
 Please let the patient know that his thyroid  is overactive again, I would suggest he start to take 2 together every morning of the methimazole , and I will place a referral to the surgeon   Thanks

## 2024-09-12 DIAGNOSIS — E0500 Thyrotoxicosis with diffuse goiter without thyrotoxic crisis or storm: Secondary | ICD-10-CM | POA: Diagnosis not present

## 2025-01-31 ENCOUNTER — Ambulatory Visit: Admitting: Internal Medicine
